# Patient Record
Sex: Male | Born: 1960 | Race: White | Hispanic: No | Marital: Married
Health system: Southern US, Community
[De-identification: ages and names within clinical notes are randomized; demographics above are authoritative.]

## PROBLEM LIST (undated history)

## (undated) HISTORY — PX: CARPAL TUNNEL RELEASE: SHX101

## (undated) HISTORY — PX: APPENDECTOMY: SHX54

## (undated) HISTORY — PX: LEFT HEART CATH: SHX5946

## (undated) HISTORY — PX: ANTERIOR CRUCIATE LIGAMENT REPAIR: SHX115

---

## 2007-10-08 ENCOUNTER — Ambulatory Visit: Payer: Self-pay | Admitting: Internal Medicine

## 2008-10-15 ENCOUNTER — Emergency Department: Payer: Self-pay | Admitting: Emergency Medicine

## 2018-12-10 ENCOUNTER — Other Ambulatory Visit: Payer: Self-pay | Admitting: Obstetrics and Gynecology

## 2018-12-10 ENCOUNTER — Other Ambulatory Visit: Payer: Self-pay | Admitting: Otolaryngology

## 2018-12-10 DIAGNOSIS — H90A22 Sensorineural hearing loss, unilateral, left ear, with restricted hearing on the contralateral side: Secondary | ICD-10-CM

## 2018-12-25 ENCOUNTER — Ambulatory Visit: Payer: BC Managed Care – PPO

## 2018-12-28 ENCOUNTER — Other Ambulatory Visit: Payer: Self-pay

## 2018-12-28 ENCOUNTER — Ambulatory Visit
Admission: RE | Admit: 2018-12-28 | Discharge: 2018-12-28 | Disposition: A | Discharge status: Home / Self Care | Payer: BC Managed Care – PPO | Source: Ambulatory Visit | Attending: Otolaryngology | Admitting: Otolaryngology

## 2018-12-28 DIAGNOSIS — H90A22 Sensorineural hearing loss, unilateral, left ear, with restricted hearing on the contralateral side: Secondary | ICD-10-CM

## 2018-12-28 MED ORDER — GADOBUTROL 1 MMOL/ML IV SOLN
10.0000 mL | Freq: Once | INTRAVENOUS | Status: AC | PRN
  Administered 2018-12-28: 10 mL via INTRAVENOUS

## 2019-03-15 ENCOUNTER — Ambulatory Visit: Payer: Self-pay

## 2019-03-15 DIAGNOSIS — Z23 Encounter for immunization: Secondary | ICD-10-CM

## 2019-06-20 ENCOUNTER — Ambulatory Visit: Payer: BC Managed Care – PPO | Attending: Internal Medicine

## 2019-06-20 DIAGNOSIS — Z20822 Contact with and (suspected) exposure to covid-19: Secondary | ICD-10-CM

## 2019-06-22 LAB — NOVEL CORONAVIRUS, NAA: SARS-CoV-2, NAA: NOT DETECTED

## 2019-12-10 ENCOUNTER — Telehealth: Payer: Self-pay

## 2019-12-17 NOTE — Telephone Encounter (Signed)
Script note 

## 2021-03-30 IMAGING — MR MR BRAIN/IAC WITHOUT AND WITH CONTRAST
10 of 14 series · 27 of 48 positions shown · IV contrast (gadavist)
Comparison: No prior studies available for comparison

CLINICAL DATA: Meniere's disease times 15 years, vertigo, left ear
pain and hearing loss, attention IAC's

EXAM:
MRI HEAD WITHOUT AND WITH CONTRAST
TECHNIQUE: Multiplanar, multiecho pulse sequences of the brain and surrounding
structures were obtained without and with intravenous contrast.
CONTRAST:  10 mL intravenous Gadavist

[Series 5: T1 · sagittal · 5.0mm · 0.62mm/px · 2 of 25 slices shown (1 of 3)]
[im 1/25]
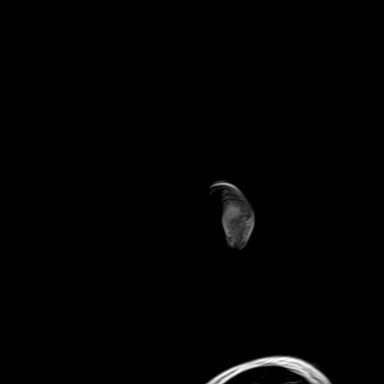
[im 25/25]
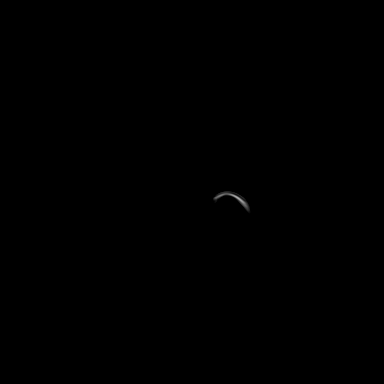

[Series 6: ax dwi_tracew · axial · 3.0mm · 0.73mm/px · z∈[-95,+62]mm · 4 of 55 slices shown]
[im 1/55]
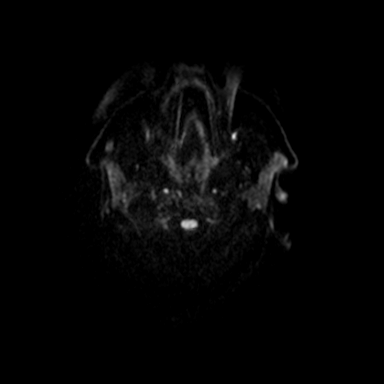
[im 19/55]
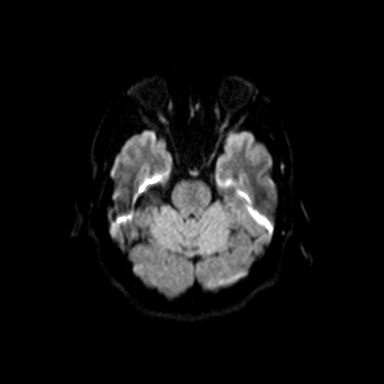
[im 37/55]
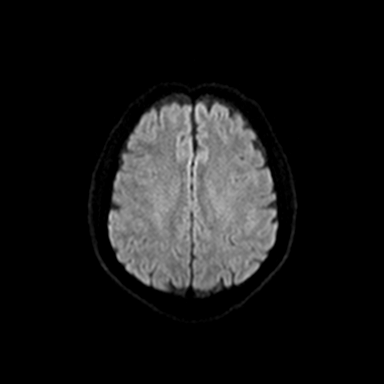
[im 55/55]
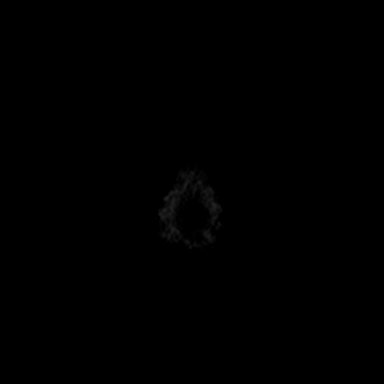

[Series 7: ax dwi_adc · axial · 3.0mm · 0.73mm/px · z∈[-95,-43]mm · 2 of 55 slices shown]
[im 1/55]
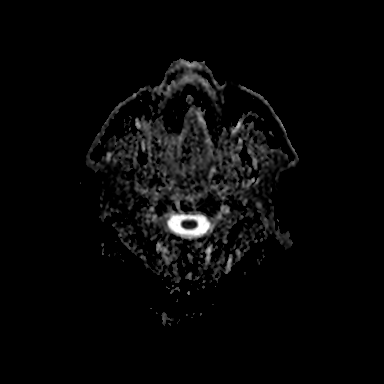
[im 19/55]
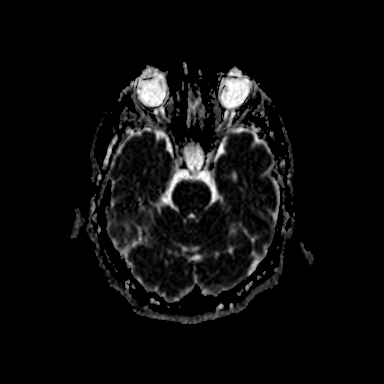

[Series 8: T2 · axial · 5.0mm · 0.53mm/px · z∈[-89,+51]mm · 2 of 25 slices shown]
[im 1/25]
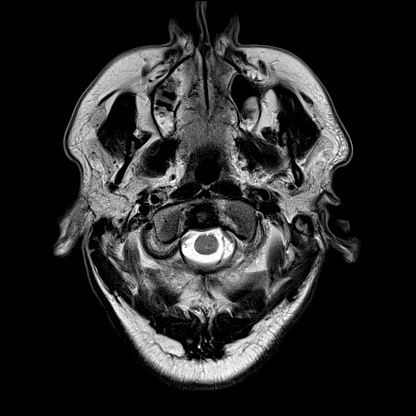
[im 25/25]
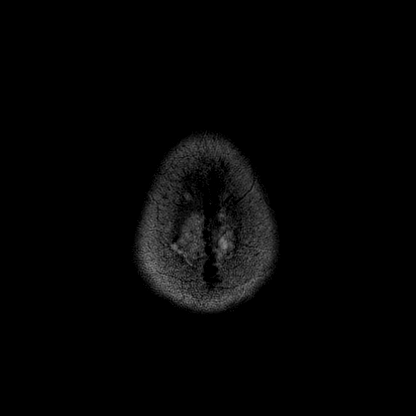

[Series 13: T1 · coronal · non-contrast · 3.0mm · 0.21mm/px · 1 of 13 slices shown (2 of 3)]
[im 1/13]
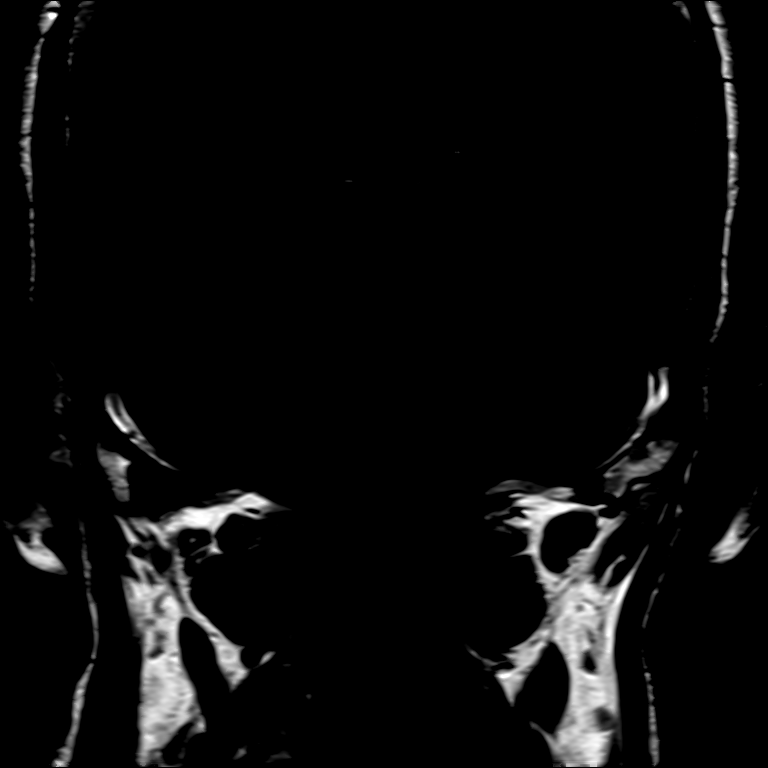

[Series 14: FLAIR · axial · 3.0mm · 0.53mm/px · z∈[-98,+60]mm · 4 of 55 slices shown]
[im 1/55]
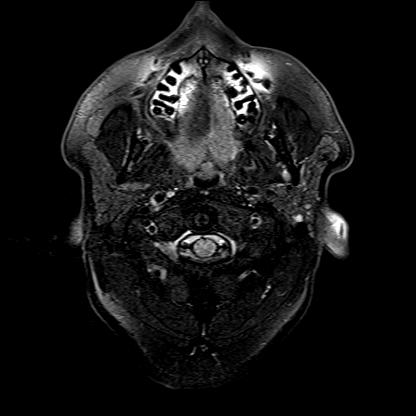
[im 19/55]
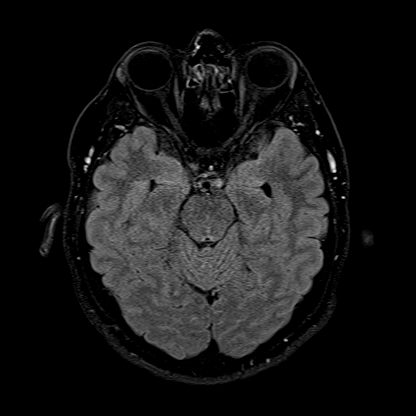
[im 37/55]
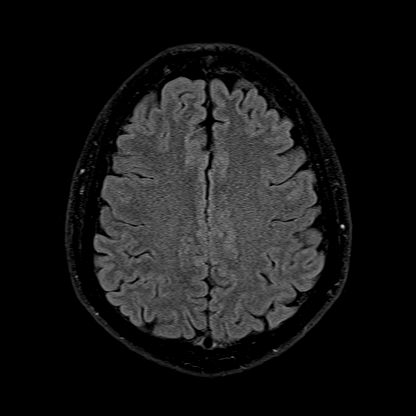
[im 55/55]
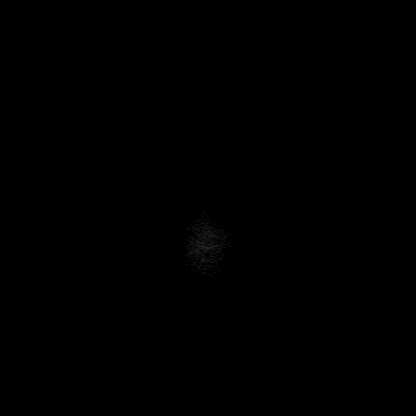

[Series 16: T1 · axial · non-contrast · 3.0mm · 0.21mm/px · 1 of 15 slices shown (3 of 3)]
[im 1/15]
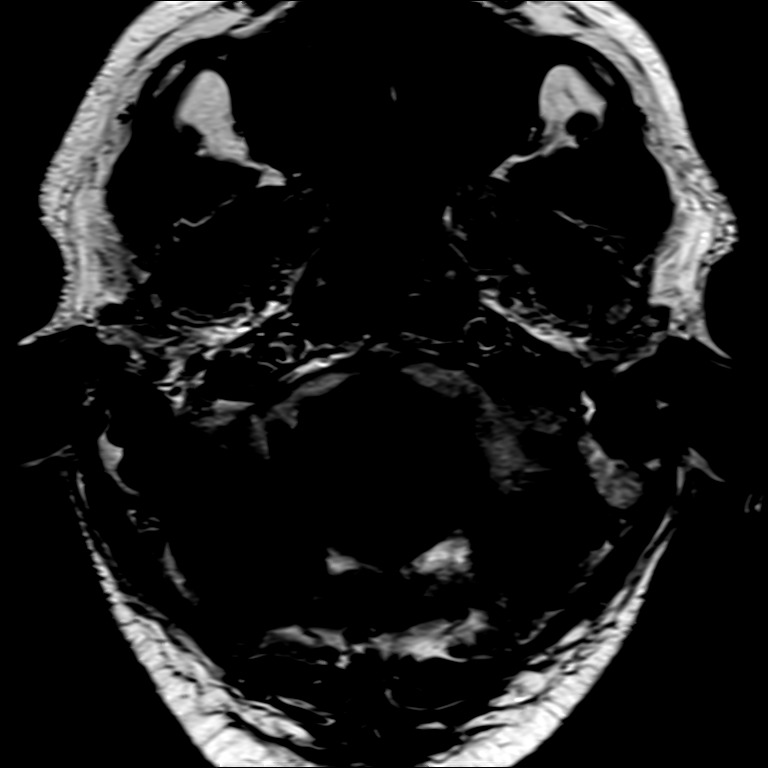

[Series 17: T1 post-contrast · axial · 3.0mm · 0.21mm/px · 1 of 15 slices shown (1 of 3)]
[im 1/15]
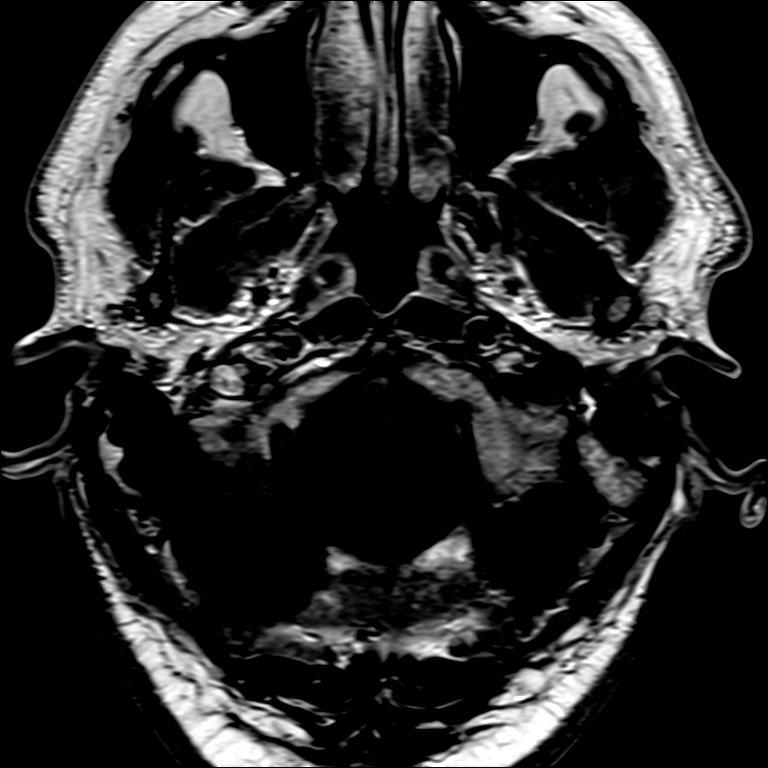

[Series 18: T1 post-contrast · coronal · 3.0mm · 0.21mm/px · 1 of 13 slices shown (2 of 3)]
[im 1/13]
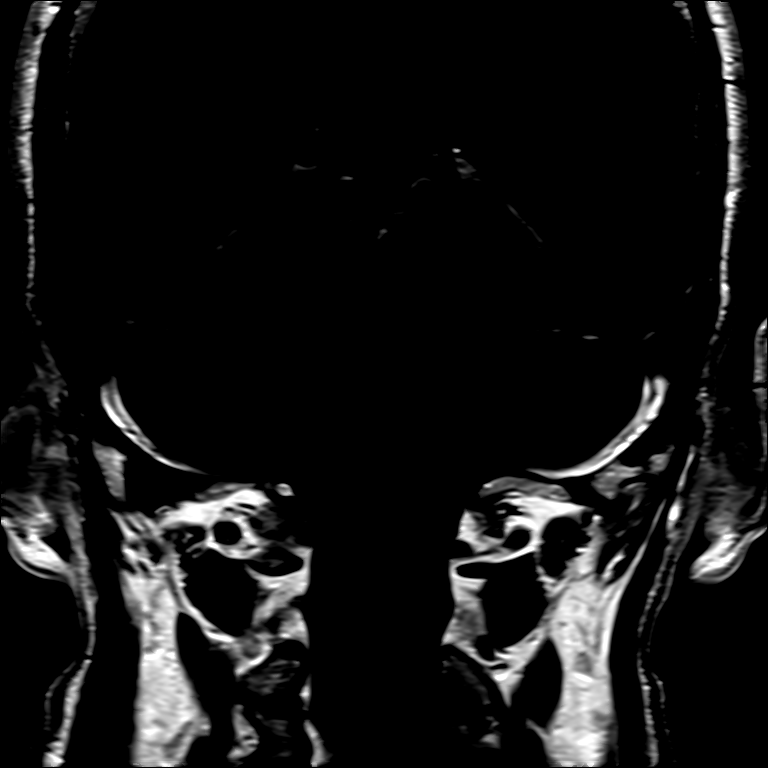

[Series 19: T1 post-contrast · axial · 1.0mm · 0.98mm/px · z∈[-102,+69]mm · 9 of 176 slices shown (3 of 3)]
[im 1/176]
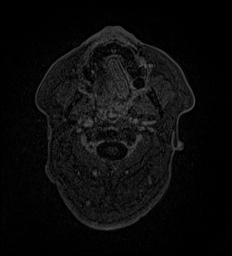
[im 32/176]
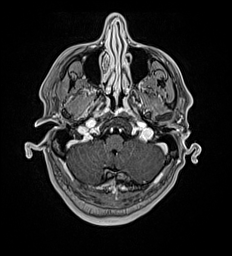
[im 48/176]
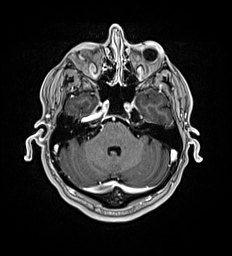
[im 80/176]
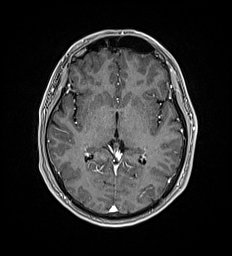
[im 96/176]
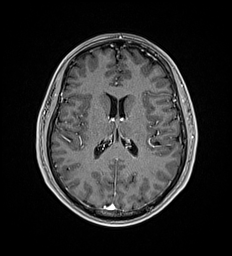
[im 128/176]
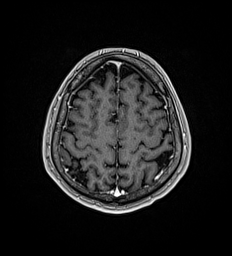
[im 144/176]
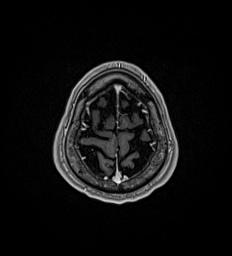
[im 160/176]
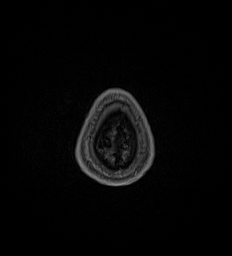
[im 176/176]
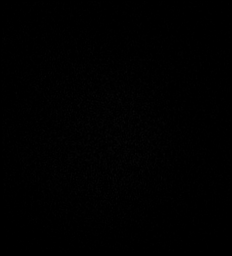

[27 of 48 positions shown; findings below may reference images not displayed]

FINDINGS: Brain: There is no focal parenchymal signal abnormality. No acute
infarct, intracranial mass, intracranial hemorrhage, midline shift
or extra-axial collection. Specifically, no cerebellopontine angle
mass or internal auditory canal lesion is demonstrated. The seventh
and eighth cranial nerves are unremarkable bilaterally.

Vascular: Normal flow voids.

Skull and upper cervical spine: Normal marrow signal.

Sinuses/Orbits: The globes and orbits are unremarkable. No
significant paranasal sinus disease or mastoid effusion.
IMPRESSION: No cerebellopontine angle mass or internal auditory canal lesion.
The seventh and eighth cranial nerves are unremarkable bilaterally.

## 2021-06-13 DIAGNOSIS — H8102 Meniere's disease, left ear: Secondary | ICD-10-CM

## 2021-06-13 HISTORY — DX: Meniere's disease, left ear: H81.02

## 2023-03-28 ENCOUNTER — Encounter: Payer: Self-pay | Admitting: *Deleted

## 2023-03-28 ENCOUNTER — Ambulatory Visit
Admission: EM | Admit: 2023-03-28 | Discharge: 2023-03-28 | Disposition: A | Discharge status: Home / Self Care | Payer: BC Managed Care – PPO | Attending: Emergency Medicine | Admitting: Emergency Medicine

## 2023-03-28 DIAGNOSIS — J069 Acute upper respiratory infection, unspecified: Secondary | ICD-10-CM

## 2023-03-28 MED ORDER — DOXYCYCLINE HYCLATE 100 MG PO CAPS: 100.0000 mg | ORAL_CAPSULE | Freq: Two times a day (BID) | ORAL | 0 refills | Status: AC

## 2023-03-28 NOTE — Discharge Instructions (Addendum)
Rest,push fluids,May try using over the counter throat lozenges, mucinex or sudafed as label directed, hot tea, honey for cough.  Take antibiotic as directed, drink plenty of water.  If you have worsening symptoms such as chest pain palpitations shortness of breath you will need to go to the emergency room for further evaluation.

## 2023-03-28 NOTE — ED Provider Notes (Signed)
Vincent Arias    CSN: [id number] Arrival date & time: 03/28/23  1848      History   Chief Complaint Chief Complaint  Patient presents with   Cough    HPI Vincent Arias is a 62 y.o. male.   62 year old male pt, Vincent Arias, presents to urgent care for evaluation of cough/chest congestion x 1 week. Wife had recent URI and was treated with abx and imporved pt is going out of town next week, hx of pneumonia last year.   The history is provided by the patient. No language interpreter was used.    Past Medical History:  Diagnosis Date   Meniere disease of left ear 2023   had surgery to repair    Patient Active Problem List   Diagnosis Date Noted   Acute URI 03/28/2023    History reviewed. No pertinent surgical history.     Home Medications    Prior to Admission medications   Not on File    Family History History reviewed. No pertinent family history.  Social History Social History   Tobacco Use   Smoking status: Never   Smokeless tobacco: Never  Vaping Use   Vaping status: Never Used  Substance Use Topics   Alcohol use: Not Currently     Allergies   Patient has no allergy information on record.   Review of Systems Review of Systems  Constitutional:  Negative for fever.  HENT:  Positive for congestion.   Respiratory:  Positive for cough. Negative for wheezing.   Cardiovascular:  Negative for chest pain and palpitations.  All other systems reviewed and are negative.    Physical Exam Triage Vital Signs ED Triage Vitals  Encounter Vitals Group     BP 03/28/23 1859 (!) 134/90     Systolic BP Percentile --      Diastolic BP Percentile --      Pulse Rate 03/28/23 1859 99     Resp 03/28/23 1859 18     Temp 03/28/23 1859 98.5 F (36.9 C)     Temp src --      SpO2 03/28/23 1859 95 %     Weight 03/28/23 1907 220 lb (99.8 kg)     Height 03/28/23 1907 5\' 9"  (1.753 m)     Head Circumference --      Peak Flow --      Pain  Score 03/28/23 1906 4     Pain Loc --      Pain Education --      Exclude from Growth Chart --    No data found.  Updated Vital Signs BP (!) 134/90   Pulse 99   Temp 98.5 F (36.9 C)   Resp 18   Ht 5\' 9"  (1.753 m)   Wt 220 lb (99.8 kg)   SpO2 95%   BMI 32.49 kg/m   Visual Acuity Right Eye Distance:   Left Eye Distance:   Bilateral Distance:    Right Eye Near:   Left Eye Near:    Bilateral Near:     Physical Exam Vitals and nursing note reviewed.  Constitutional:      General: He is active. He is not in acute distress.    Appearance: He is well-developed. He is not ill-appearing or toxic-appearing.  HENT:     Head: Normocephalic.     Right Ear: Tympanic membrane is retracted.     Left Ear: Tympanic membrane is retracted.     Nose:  Mucosal edema present.     Mouth/Throat:     Mouth: Oropharynx is clear and moist and mucous membranes are normal.     Pharynx: Uvula midline.  Eyes:     General: Lids are normal.     Extraocular Movements: EOM normal.     Conjunctiva/sclera: Conjunctivae normal.     Pupils: Pupils are equal, round, and reactive to light.  Cardiovascular:     Rate and Rhythm: Normal rate and regular rhythm.     Pulses: Normal pulses.     Heart sounds: Normal heart sounds.  Pulmonary:     Effort: Pulmonary effort is normal. No respiratory distress.     Breath sounds: Normal breath sounds and air entry. No decreased breath sounds or wheezing.  Abdominal:     General: There is no distension.     Palpations: Abdomen is soft.  Musculoskeletal:        General: Normal range of motion.     Cervical back: Normal range of motion.  Skin:    General: Skin is warm, dry and intact.     Findings: No rash.  Neurological:     General: No focal deficit present.     Mental Status: He is alert and oriented to person, place, and time.     GCS: GCS eye subscore is 4. GCS verbal subscore is 5. GCS motor subscore is 6.     Cranial Nerves: No cranial nerve deficit.      Sensory: No sensory deficit.  Psychiatric:        Attention and Perception: Attention normal.        Mood and Affect: Mood and affect and mood normal.        Speech: Speech normal.        Behavior: Behavior normal. Behavior is cooperative.      UC Treatments / Results  Labs (all labs ordered are listed, but only abnormal results are displayed) Labs Reviewed - No data to display  EKG   Radiology No results found.  Procedures Procedures (including critical care time)  Medications Ordered in UC Medications - No data to display  Initial Impression / Assessment and Plan / UC Course  I have reviewed the triage vital signs and the nursing notes.  Pertinent labs & imaging results that were available during my care of the patient were reviewed by me and considered in my medical decision making (see chart for details).     Ddx: Acute urti, viral illness,allergies Final Clinical Impressions(s) / UC Diagnoses   Final diagnoses:  Acute URI     Discharge Instructions      Rest,push fluids,May try using over the counter throat lozenges, mucinex or sudafed as label directed, hot tea, honey for cough.  Take antibiotic as directed, drink plenty of water.  If you have worsening symptoms such as chest pain palpitations shortness of breath you will need to go to the emergency room for further evaluation.       ED Prescriptions   None    PDMP not reviewed this encounter.   Clancy Gourd, NP 03/28/23 1924

## 2023-03-28 NOTE — ED Triage Notes (Signed)
Patient states cough/chest congestion for 1 week, feels warm at times but no fever when checked, bodyaches.  Taking OTC Tylenol, Dayquil.

## 2023-06-26 ENCOUNTER — Ambulatory Visit
Admission: EM | Admit: 2023-06-26 | Discharge: 2023-06-26 | Disposition: A | Discharge status: Home / Self Care | Payer: BC Managed Care – PPO

## 2023-06-26 DIAGNOSIS — M542 Cervicalgia: Secondary | ICD-10-CM | POA: Diagnosis not present

## 2023-06-26 MED ORDER — PREDNISONE 10 MG (21) PO TBPK: ORAL_TABLET | Freq: Every day | ORAL | 0 refills | Status: DC

## 2023-06-26 NOTE — Discharge Instructions (Signed)
 I believe your symptoms are related to muscular irritation with nerve compression which has caused pain to radiate into the arms and jolt into the chest  Do not believe your heart is involved at this time due to timeline of symptoms and then present  EKG shows that heart is beating in a regular pace and rhythm and blood pressure and heart rate are within normal ranges  Stop meloxicam temporarily and begin prednisone  every morning with food for 6 days to reduce internal inflammation which will release with nerve compression and help with discomfort  You may continue use of Tylenol additionally as needed  May use heat over the affected area 10 to 15-minute intervals  May massage and stretch area as tolerated  If your symptoms continue to persist please follow-up with your primary doctor for reevaluation

## 2023-06-26 NOTE — ED Provider Notes (Signed)
 [facility]    CSN: [id number] Arrival date & time: 06/26/23  1449      History   Chief Complaint Chief Complaint  Patient presents with   Chest Pain    HPI Vincent Arias is a 63 y.o. male.   Presents for evaluation of neck pain present at the base of the skull, pain behind the right arm radiating down the right side of the back and down the right with numbness experienced in the hand beginning 3 to 4 months ago.  Has intermittent sharp jolting pain to the center of the chest.  Symptoms occurring intermittently.  Denies direct injury but endorses pushing pulling lifting [place]  Endorses to the nurse that this feels like muscle pain but endorses to this provider that it feels like nerve discomfort.  Has been intermittently experiencing dizziness described as feeling unbalanced like on a boat with visual changes during time of occurrence, history of Mnire's, feels that current symptoms are exacerbating dizziness from baseline.  Endorses that he feels visual changes are related to his eyes and needing new glasses as he has not seen the ophthalmologist in 2 years and has been having issues with vision while driving for long distance.  Denies head injury, lightheadedness, syncope, memory or speech changes or weakness.  Has been taking Tylenol arthritis which has been somewhat helpful.  History of hypertension hyperlipidemia.  Has PCP.  Past Medical History:  Diagnosis Date   Meniere disease of left ear 2023   had surgery to repair    Patient Active Problem List   Diagnosis Date Noted   Acute URI 03/28/2023    Past Surgical History:  Procedure Laterality Date   ANTERIOR CRUCIATE LIGAMENT REPAIR     APPENDECTOMY     CARPAL TUNNEL RELEASE     LEFT HEART CATH         Home Medications    Prior to Admission medications   Medication Sig Start Date End Date Taking? Authorizing Provider  azelastine (ASTELIN) 0.1 % nasal spray Place into  the nose. 01/13/23  Yes [provider]  meclizine (ANTIVERT) 25 MG tablet Take by mouth. 12/10/18  Yes [provider]  predniSONE  (STERAPRED UNI-PAK 21 TAB) 10 MG (21) TBPK tablet Take by mouth daily. Take 6 tabs by mouth daily  for 1 days, then 5 tabs for 1 days, then 4 tabs for 1 days, then 3 tabs for 1 days, 2 tabs for 1 days, then 1 tab by mouth daily for 1 days 06/26/23  Yes Simcha Speir R, NP  amphetamine-dextroamphetamine (ADDERALL XR) 20 MG 24 hr capsule Take 20 mg by mouth every morning.    [provider]  fluticasone (FLONASE) 50 MCG/ACT nasal spray Place 2 sprays into both nostrils daily.    [provider]    Family History History reviewed. No pertinent family history.  Social History Social History   Tobacco Use   Smoking status: Never   Smokeless tobacco: Never  Vaping Use   Vaping status: Never Used  Substance Use Topics   Alcohol use: Not Currently     Allergies   Patient has no known allergies.   Review of Systems Review of Systems   Physical Exam Triage Vital Signs ED Triage Vitals  Encounter Vitals Group     BP 06/26/23 1522 138/80     Systolic BP Percentile --      Diastolic BP Percentile --  Pulse Rate 06/26/23 1522 85     Resp 06/26/23 1522 17     Temp 06/26/23 1522 98 F (36.7 C)     Temp src --      SpO2 06/26/23 1522 98 %     Weight --      Height --      Head Circumference --      Peak Flow --      Pain Score 06/26/23 1513 0     Pain Loc --      Pain Education --      Exclude from Growth Chart --    No data found.  Updated Vital Signs BP 138/80   Pulse 85   Temp 98 F (36.7 C)   Resp 17   SpO2 98%   Visual Acuity Right Eye Distance:   Left Eye Distance:   Bilateral Distance:    Right Eye Near:   Left Eye Near:    Bilateral Near:     Physical Exam Constitutional:      Appearance: Normal appearance.  Eyes:     Extraocular Movements: Extraocular movements intact.  Neck:      Comments: Unable to reproduce neck pain, has full range of motion, 2+ carotid pulses bilaterally strength 5 out of 5 Cardiovascular:     Rate and Rhythm: Normal rate and regular rhythm.     Pulses: Normal pulses.     Heart sounds: Normal heart sounds.  Pulmonary:     Effort: Pulmonary effort is normal.     Breath sounds: Normal breath sounds.  Musculoskeletal:     Comments: Tenderness not reproducible, no ecchymosis swelling or deformity, able to complete range of motion of the bilateral upper extremities and back, strength is a 5 out of 5  Neurological:     Mental Status: He is alert and oriented to person, place, and time. Mental status is at baseline.      UC Treatments / Results  Labs (all labs ordered are listed, but only abnormal results are displayed) Labs Reviewed - No data to display  EKG   Radiology No results found.  Procedures Procedures (including critical care time)  Medications Ordered in UC Medications - No data to display  Initial Impression / Assessment and Plan / UC Course  I have reviewed the triage vital signs and the nursing notes.  Pertinent labs & imaging results that were available during my care of the patient were reviewed by me and considered in my medical decision making (see chart for details).  Neck pain  Vital signs are stable, patient is in no signs of distress nontoxic-appearing, EKG shows normal sinus rhythm, blood pressure and heart rate are within normal ranges, stable for outpatient management, etiology is most likely muscular deriving from the neck, the patient indicating pain to be to the right side during triage but during exam  indicated the left, discussed with patient we will move forward with treatment as such, takes daily meloxicam advised to stop use temporarily during course of prednisone , may take Tylenol and complete RICE additionally with follow-up with primary doctor if symptoms continue to persist worsen or recur Final  Clinical Impressions(s) / UC Diagnoses   Final diagnoses:  Neck pain     Discharge Instructions      I believe your symptoms are related to muscular irritation with nerve compression which has caused pain to radiate into the arms and jolt into the chest  Do not believe your heart is involved at  this time due to timeline of symptoms and then present  EKG shows that heart is beating in a regular pace and rhythm and blood pressure and heart rate are within normal ranges  Stop meloxicam temporarily and begin prednisone  every morning with food for 6 days to reduce internal inflammation which will release with nerve compression and help with discomfort  You may continue use of Tylenol additionally as needed  May use heat over the affected area 10 to 15-minute intervals  May massage and stretch area as tolerated  If your symptoms continue to persist please follow-up with your primary doctor for reevaluation   ED Prescriptions     Medication Sig Dispense Auth. Provider   predniSONE  (STERAPRED UNI-PAK 21 TAB) 10 MG (21) TBPK tablet Take by mouth daily. Take 6 tabs by mouth daily  for 1 days, then 5 tabs for 1 days, then 4 tabs for 1 days, then 3 tabs for 1 days, 2 tabs for 1 days, then 1 tab by mouth daily for 1 days 21 tablet Isidora Laham, Shelba SAUNDERS, NP      PDMP not reviewed this encounter.   Teresa Shelba SAUNDERS, NP 06/26/23 [phone]

## 2023-06-26 NOTE — ED Triage Notes (Signed)
 Patient to Urgent Care with complaints of back and right sided arm pain. Occasional shock or jolt of pain through his chest. Denies CP or SHOB.  Symptoms started 3-4 months ago w/ repeated arm/ back pain/ dizziness. Hx of vertigo and meniere's disease. Feels like now having more right sided arm and back pain. Describes pain feels like muscle pain.

## 2023-08-23 ENCOUNTER — Ambulatory Visit: Payer: BC Managed Care – PPO | Attending: Cardiovascular Disease | Admitting: Cardiovascular Disease

## 2023-08-23 ENCOUNTER — Encounter: Payer: Self-pay | Admitting: Cardiovascular Disease

## 2023-08-23 ENCOUNTER — Other Ambulatory Visit: Payer: Self-pay

## 2023-08-23 VITALS — BP 125/98 | HR 87 | Ht 69.0 in | Wt 211.0 lb

## 2023-08-23 DIAGNOSIS — E7800 Pure hypercholesterolemia, unspecified: Secondary | ICD-10-CM | POA: Diagnosis not present

## 2023-08-23 DIAGNOSIS — R079 Chest pain, unspecified: Secondary | ICD-10-CM

## 2023-08-23 DIAGNOSIS — G4733 Obstructive sleep apnea (adult) (pediatric): Secondary | ICD-10-CM

## 2023-08-23 DIAGNOSIS — I10 Essential (primary) hypertension: Secondary | ICD-10-CM

## 2023-08-23 DIAGNOSIS — Z01812 Encounter for preprocedural laboratory examination: Secondary | ICD-10-CM

## 2023-08-23 MED ORDER — TRIAMTERENE-HCTZ 37.5-25 MG PO TABS: 1.0000 | ORAL_TABLET | Freq: Every day | ORAL | 3 refills | Status: DC

## 2023-08-23 MED ORDER — LOSARTAN POTASSIUM 100 MG PO TABS: 50.0000 mg | ORAL_TABLET | Freq: Every day | ORAL | 3 refills | Status: DC

## 2023-08-23 MED ORDER — METOPROLOL TARTRATE 100 MG PO TABS: 100.0000 mg | ORAL_TABLET | Freq: Once | ORAL | 0 refills | Status: DC

## 2023-08-23 NOTE — Patient Instructions (Addendum)
 Medication Instructions:  Metoprolol 100 mg by mouth once 2 hours prior to CTA test.  Start Losartan 100 mg take 1/2 tablet once daily. Scripts sent *If you need a refill on your cardiac medications before your next appointment, please call your pharmacy*   Testing/Procedures:   Your cardiac CT will be scheduled at one of the below locations:   [facility] [address] [place], [place] [address] [phone]  If scheduled at [facility], please arrive at the [facility] and Children's Entrance (Entrance C2) of [facility] 30 minutes prior to test start time. You can use the FREE valet parking offered at entrance C (encouraged to control the heart rate for the test)  Proceed to the [facility] Radiology Department (first floor) to check-in and test prep.  All radiology patients and guests should use entrance C2 at [facility], accessed from [facility], even though the hospital's physical address listed is [address]    Please follow these instructions carefully (unless otherwise directed):  An IV will be required for this test and Nitroglycerin will be given.  Hold all erectile dysfunction medications at least 3 days (72 hrs) prior to test. (Ie viagra, cialis, sildenafil, tadalafil, etc)   On the Night Before the Test: Be sure to Drink plenty of water. Do not consume any caffeinated/decaffeinated beverages or chocolate 12 hours prior to your test. Do not take any antihistamines 12 hours prior to your test.   On the Day of the Test: Drink plenty of water until 1 hour prior to the test. Do not eat any food 1 hour prior to test. You may take your regular medications prior to the test.  Take metoprolol (Lopressor) two hours prior to test. If you take Furosemide/Hydrochlorothiazide/Spironolactone/Chlorthalidone, please HOLD on the morning of the test. Patients who wear a continuous glucose monitor MUST remove the device  prior to scanning.  After the Test: Drink plenty of water. After receiving IV contrast, you may experience a mild flushed feeling. This is normal. On occasion, you may experience a mild rash up to 24 hours after the test. This is not dangerous. If this occurs, you can take Benadryl 25 mg, Zyrtec, Claritin, or Allegra and increase your fluid intake. (Patients taking Tikosyn should avoid Benadryl, and may take Zyrtec, Claritin, or Allegra) If you experience trouble breathing, this can be serious. If it is severe call [phone] IMMEDIATELY. If it is mild, please call our office.  We will call to schedule your test 2-4 weeks out understanding that some insurance companies will need an authorization prior to the service being performed.   For more information and frequently asked questions, please visit our website : [email or web address]  For non-scheduling related questions, please contact the cardiac imaging nurse navigator should you have any questions/concerns: Cardiac Imaging Nurse Navigators Direct Office Dial: [phone]   For scheduling needs, including cancellations and rescheduling, please call [place], [phone].    Follow-Up: At [facility], you and your health needs are our priority.  As part of our continuing mission to provide you with exceptional heart care, we have created designated Provider Care Teams.  These Care Teams include your primary Cardiologist (physician) and Advanced Practice Providers (APPs -  Physician Assistants and Nurse Practitioners) who all work together to provide you with the care you need, when you need it.  We recommend signing up for the patient portal called "MyChart".  Sign up information is provided on this After Visit  Summary.  MyChart is used to connect with patients for Virtual Visits (Telemedicine).  Patients are able to view lab/test results, encounter notes, upcoming appointments, etc.  Non-urgent messages can be sent to your  provider as well.   To learn more about what you can do with MyChart, go to [email or web address].    Your next appointment:   1 month(s)  Provider:   First Available APP.  Then, Dr Ruby Cola will plan to see you again in 4 month(s).    Other Instructions Keep BP log daily and sent results via My Chart message.

## 2023-08-23 NOTE — Progress Notes (Addendum)
 Cardiology Office Note:    Date:  08/27/2023   ID:  Vincent Arias, DOB 1960/11/30, MRN [id number]  PCP:  Lauro Regulus, MD   [facility] Providers Cardiologist:  None     Referring MD: Lauro Regulus, MD   Chief Complaint  Patient presents with   Hypertension       Vincent Arias is a 63 y.o. male who is being seen today for the evaluation of difficult to control hypertension at the request of Lauro Regulus, MD.   History of Present Illness:    Vincent Arias is a 63 y.o. male with a hx of WPW syndrome, hypertension, OSA on CPAP, ADHD, Mnire's disease, prediabetes/hyperlipidemia, false positive stress test leading to a negative catheterization Vincent Arias, minor 15-20% plaquing in the mid LAD; 20-25% proximal RCA).    His blood pressure was previously easily controlled, but recently has become much higher and symptomatic.  In July 2024 his blood pressure was 138/79 at a visit with his PCP he has had issues with headache, dizziness and vision impairment.  At one point he also had some pain radiating down his left arm.  At home his blood pressure was as high as 159/115 mmHg.  He had a prescription for triamterene-hydrochlorothiazide which she had taken in the past for Mnire's disease and started taking it about a week ago.  Today's blood pressure on arrival was 120/100 with HTN.  When I rechecked a few minutes later the diastolic blood pressure was still very high: 125/98 mmHg.  Today he does does not feel particularly bad despite the elevation in blood pressure.  He denies headaches or any focal neurological complaints.  He has not experienced syncope or palpitations.  He denies shortness of breath or chest pain with activity, but did have pain going down his left arm when his blood pressure was very high more than a week ago.  He was prescribed a prednisone Dosepak in January.  He had completed it before his blood pressure  began to rise.  He does have a prescription for meloxicam for joint problems.  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  Ablation was unsuccessful, but it appears that he "outgrew it".  Has been many years since he had a bout of SVT.  He used CPAP for a while for sleep apnea but has been noncompliant since 2020.  He had COVID 19 infection at that time and could not tolerate CPAP.  He never started wearing it again since then.  He has been on Adderall XR for ADHD for a long time.  Labs December 19, 2022 include hemoglobin A1c 5.9%, potassium 4.5, creatinine 0.8, normal liver function tests, cholesterol 201, triglycerides 161, HDL 44, calculated LDL 125  Past Medical History:  Diagnosis Date   Meniere disease of left ear 2023   had surgery to repair    Past Surgical History:  Procedure Laterality Date   ANTERIOR CRUCIATE LIGAMENT REPAIR     APPENDECTOMY     CARPAL TUNNEL RELEASE     LEFT HEART CATH      Current Medications: Current Meds  Medication Sig   amphetamine-dextroamphetamine (ADDERALL XR) 20 MG 24 hr capsule Take 20 mg by mouth every morning.   azelastine (ASTELIN) 0.1 % nasal spray Place into the nose.   fluticasone (FLONASE) 50 MCG/ACT nasal spray Place 2 sprays into both nostrils daily.   losartan (COZAAR) 100 MG tablet Take  0.5 tablets (50 mg total) by mouth daily.   meclizine (ANTIVERT) 25 MG tablet Take by mouth as needed.   meloxicam (MOBIC) 15 MG tablet Take 15 mg by mouth daily.   metoprolol tartrate (LOPRESSOR) 100 MG tablet Take 1 tablet (100 mg total) by mouth once for 1 dose. 2 hours prior to Cta procedure.   [DISCONTINUED] triamterene-hydrochlorothiazide (MAXZIDE-25) 37.5-25 MG tablet Take 1 tablet by mouth daily.     Allergies:   Patient has no known allergies.   Family History: The patient's family history is not on file.  ROS:   Please see the history of present illness.     All other systems reviewed and are  negative.  EKGs/Labs/Other Studies Reviewed:    The following studies were reviewed today:  EKG Interpretation Date/Time:  Wednesday August 23 2023 10:13:44 EDT Ventricular Rate:  87 PR Interval:  148 QRS Duration:  82 QT Interval:  358 QTC Calculation: 430 R Axis:   -2  Text Interpretation: Normal sinus rhythm Cannot rule out Inferior infarct , age undetermined When compared with ECG of 26-Jun-2023 15:28, Criteria for Septal infarct are no longer Present Minimal criteria for Inferior infarct are now Present Confirmed by Zyairah Wacha [phone]) on 08/23/2023 10:40:55 AM    Recent Labs: 08/23/2023: ALT 43; BUN 20; Creatinine, Ser 0.94; Hemoglobin 17.3; Platelets 309; Potassium 5.1; Sodium 137  Recent Lipid Panel No results found for: "CHOL", "TRIG", "HDL", "CHOLHDL", "VLDL", "LDLCALC", "LDLDIRECT" December 19, 2022 hemoglobin A1c 5.9%, potassium 4.5, creatinine 0.8, normal liver function tests cholesterol 201, triglycerides 161, HDL 44, calculated LDL 125  Risk Assessment/Calculations:      HYPERTENSION CONTROL Vitals:   08/23/23 1009 08/27/23 2142 08/27/23 2143  BP: (!) 128/100 (!) 125/98 (!) 125/98    The patient's blood pressure is elevated above target today.  In order to address the patient's elevated BP: A new medication was prescribed today.            Physical Exam:    VS:  BP (!) 125/98   Pulse 87   Ht 5\' 9"  (1.753 m)   Wt 211 lb (95.7 kg)   SpO2 96%   BMI 31.16 kg/m     Wt Readings from Last 3 Encounters:  08/23/23 211 lb (95.7 kg)  03/28/23 220 lb (99.8 kg)     GEN: Mildly obese, well nourished, well developed in no acute distress HEENT: Normal NECK: No JVD; No carotid bruits LYMPHATICS: No lymphadenopathy CARDIAC: RRR, no murmurs, rubs, gallops RESPIRATORY:  Clear to auscultation without rales, wheezing or rhonchi  ABDOMEN: Soft, non-tender, non-distended MUSCULOSKELETAL:  No edema; No deformity  SKIN: Warm and dry NEUROLOGIC:  Alert and oriented x  3 PSYCHIATRIC:  Normal affect   ASSESSMENT:    1. Hypertension, unspecified type   2. Chest pain of uncertain etiology   3. Chest pain, unspecified type   4. Pre-procedure lab exam   5. Hypercholesterolemia   6. OSA (obstructive sleep apnea)    PLAN:    In order of problems listed above:  Chest pain: He had clear evidence of coronary atherosclerosis and remote cardiac catheterization, albeit nonobstructive.  It is possible that his angina occurred due to severe blood pressure elevation, but recommend repeat evaluation with coronary CT angiography.  It take aspirin 81 mg daily until we clarify his diagnosis.  Did not take the diuretic all losartan on the day of his CT. HTN: Relatively late onset of hypertension and severely elevated diastolic blood pressure raises concern  for possible renal artery stenosis.  Will start treatment with losartan 50 mg once daily, but gave him a prescription for the 100 mg tablets since I think that is eventually the dose he will need.  Schedule for renal duplex ultrasound if his blood pressure fails to decrease or if we find that he has worsening renal function.  He should stop taking long-acting NSAID such as meloxicam and use short acting agents such as ibuprofen very sparingly.  Prefer acetaminophen. HLP: Will wait for the findings of CT angiography to decide on target LDL cholesterol, but the most recent value of LDL 125 is too high regards.  If he has substantial CAD, plan target LDL less than 70 and he will require statin therapy with a potent agent such as atorvastatin or rosuvastatin. OSA: Encourage 100% compliance with CPAP since untreated sleep apnea may also increase his blood pressure.  He has not used CPAP in the last 5 years since his COVID-19 infection.  Will probably need to start up again with a new split-night study.           Medication Adjustments/Labs and Tests Ordered: Current medicines are reviewed at length with the patient today.   Concerns regarding medicines are outlined above.  Orders Placed This Encounter  Procedures   CT CORONARY MORPH W/CTA COR W/SCORE W/CA W/CM &/OR WO/CM   CBC with Differential/Platelet   Comprehensive Metabolic Panel (CMET)   EKG 12-Lead   Meds ordered this encounter  Medications   losartan (COZAAR) 100 MG tablet    Sig: Take 0.5 tablets (50 mg total) by mouth daily.    Dispense:  90 tablet    Refill:  3   metoprolol tartrate (LOPRESSOR) 100 MG tablet    Sig: Take 1 tablet (100 mg total) by mouth once for 1 dose. 2 hours prior to Cta procedure.    Dispense:  1 tablet    Refill:  0    Patient Instructions  Medication Instructions:  Metoprolol 100 mg by mouth once 2 hours prior to CTA test.  Start Losartan 100 mg take 1/2 tablet once daily. Scripts sent *If you need a refill on your cardiac medications before your next appointment, please call your pharmacy*   Testing/Procedures:   Your cardiac CT will be scheduled at one of the below locations:   [facility] [address] [place], [place] [address] [phone]  If scheduled at [facility], please arrive at the [facility] and Children's Entrance (Entrance C2) of [facility] 30 minutes prior to test start time. You can use the FREE valet parking offered at entrance C (encouraged to control the heart rate for the test)  Proceed to the [facility] Radiology Department (first floor) to check-in and test prep.  All radiology patients and guests should use entrance C2 at [facility], accessed from [facility], even though the hospital's physical address listed is [address].    Please follow these instructions carefully (unless otherwise directed):  An IV will be required for this test and Nitroglycerin will be given.  Hold all erectile dysfunction medications at least 3 days (72 hrs) prior to test. (Ie viagra, cialis, sildenafil, tadalafil, etc)   On the Night Before  the Test: Be sure to Drink plenty of water. Do not consume any caffeinated/decaffeinated beverages or chocolate 12 hours prior to your test. Do not take any antihistamines 12 hours prior to your test.   On the Day of the  Test: Drink plenty of water until 1 hour prior to the test. Do not eat any food 1 hour prior to test. You may take your regular medications prior to the test.  Take metoprolol (Lopressor) two hours prior to test. If you take Furosemide/Hydrochlorothiazide/Spironolactone/Chlorthalidone, please HOLD on the morning of the test. Patients who wear a continuous glucose monitor MUST remove the device prior to scanning.  After the Test: Drink plenty of water. After receiving IV contrast, you may experience a mild flushed feeling. This is normal. On occasion, you may experience a mild rash up to 24 hours after the test. This is not dangerous. If this occurs, you can take Benadryl 25 mg, Zyrtec, Claritin, or Allegra and increase your fluid intake. (Patients taking Tikosyn should avoid Benadryl, and may take Zyrtec, Claritin, or Allegra) If you experience trouble breathing, this can be serious. If it is severe call 911 IMMEDIATELY. If it is mild, please call our office.  We will call to schedule your test 2-4 weeks out understanding that some insurance companies will need an authorization prior to the service being performed.   For more information and frequently asked questions, please visit our website : [email or web address]  For non-scheduling related questions, please contact the cardiac imaging nurse navigator should you have any questions/concerns: Cardiac Imaging Nurse Navigators Direct Office Dial: [phone]   For scheduling needs, including cancellations and rescheduling, please call [place], [phone].    Follow-Up: At [facility], you and your health needs are our priority.  As part of our continuing mission to provide you with  exceptional heart care, we have created designated Provider Care Teams.  These Care Teams include your primary Cardiologist (physician) and Advanced Practice Providers (APPs -  Physician Assistants and Nurse Practitioners) who all work together to provide you with the care you need, when you need it.  We recommend signing up for the patient portal called "MyChart".  Sign up information is provided on this After Visit Summary.  MyChart is used to connect with patients for Virtual Visits (Telemedicine).  Patients are able to view lab/test results, encounter notes, upcoming appointments, etc.  Non-urgent messages can be sent to your provider as well.   To learn more about what you can do with MyChart, go to [email or web address].    Your next appointment:   1 month(s)  Provider:   First Available APP.  Then, Dr Ruby Cola will plan to see you again in 4 month(s).    Other Instructions Keep BP log daily and sent results via My Chart message.       Signed, Thurmon Fair, MD  08/27/2023 9:49 PM    [facility]

## 2023-08-24 ENCOUNTER — Telehealth: Payer: Self-pay | Admitting: Cardiology

## 2023-08-24 ENCOUNTER — Other Ambulatory Visit: Payer: Self-pay

## 2023-08-24 ENCOUNTER — Encounter: Payer: Self-pay | Admitting: Cardiovascular Disease

## 2023-08-24 DIAGNOSIS — G4733 Obstructive sleep apnea (adult) (pediatric): Secondary | ICD-10-CM

## 2023-08-24 LAB — CBC WITH DIFFERENTIAL/PLATELET
Basophils Absolute: 0.1 10*3/uL (ref 0.0–0.2)
Basos: 1 %
EOS (ABSOLUTE): 0.6 10*3/uL — ABNORMAL HIGH (ref 0.0–0.4)
Eos: 7 %
Hematocrit: 50.7 % (ref 37.5–51.0)
Hemoglobin: 17.3 g/dL (ref 13.0–17.7)
Immature Grans (Abs): 0 10*3/uL (ref 0.0–0.1)
Immature Granulocytes: 0 %
Lymphocytes Absolute: 3.7 10*3/uL — ABNORMAL HIGH (ref 0.7–3.1)
Lymphs: 39 %
MCH: 31.3 pg (ref 26.6–33.0)
MCHC: 34.1 g/dL (ref 31.5–35.7)
MCV: 92 fL (ref 79–97)
Monocytes Absolute: 0.9 10*3/uL (ref 0.1–0.9)
Monocytes: 9 %
Neutrophils Absolute: 4.2 10*3/uL (ref 1.4–7.0)
Neutrophils: 44 %
Platelets: 309 10*3/uL (ref 150–450)
RBC: 5.53 x10E6/uL (ref 4.14–5.80)
RDW: 12.3 % (ref 11.6–15.4)
WBC: 9.4 10*3/uL (ref 3.4–10.8)

## 2023-08-24 LAB — COMPREHENSIVE METABOLIC PANEL
ALT: 43 IU/L (ref 0–44)
AST: 34 IU/L (ref 0–40)
Albumin: 4.7 g/dL (ref 3.9–4.9)
Alkaline Phosphatase: 84 IU/L (ref 44–121)
BUN/Creatinine Ratio: 21 (ref 10–24)
BUN: 20 mg/dL (ref 8–27)
Bilirubin Total: 0.5 mg/dL (ref 0.0–1.2)
CO2: 23 mmol/L (ref 20–29)
Calcium: 10.2 mg/dL (ref 8.6–10.2)
Chloride: 98 mmol/L (ref 96–106)
Creatinine, Ser: 0.94 mg/dL (ref 0.76–1.27)
Globulin, Total: 3 g/dL (ref 1.5–4.5)
Glucose: 97 mg/dL (ref 70–99)
Potassium: 5.1 mmol/L (ref 3.5–5.2)
Sodium: 137 mmol/L (ref 134–144)
Total Protein: 7.7 g/dL (ref 6.0–8.5)
eGFR: 92 mL/min/{1.73_m2} (ref >59)

## 2023-08-24 NOTE — Telephone Encounter (Signed)
 Split Night Study ordered - patient will need appointment either after split night study or once patient receives machine. Dependant upon Dr. Norris Cross request on when to see patient.

## 2023-08-24 NOTE — Progress Notes (Signed)
 Spoke with Vincent Arias regarding the home cpap titration message from yesterdays office visit with Dr Royann Shivers. She states since the patient has not been using his CPAP and has not been seen at this facility he needs a split night study ordered which has been placed on his chart.

## 2023-08-27 ENCOUNTER — Encounter: Payer: Self-pay | Admitting: Cardiovascular Disease

## 2023-08-31 ENCOUNTER — Telehealth: Payer: Self-pay

## 2023-08-31 NOTE — Telephone Encounter (Signed)
-----   Message from Armanda Magic sent at 08/24/2023  8:43 AM EDT ----- Regarding: RE: CPAP Titration He will need a split night sleep study ordered first - can you get Dr. Royann Shivers to order it please ----- Message ----- From: Brunetta Genera, LPN Sent: 1/61/0960   8:41 AM EDT To: Quintella Reichert, MD Subject: CPAP Titration                                 Patient was seen by Dr. Royann Shivers yesterday and he wants to start back using his CPAP. I sent Tacey Ruiz a message to get him on your schedule. He has not used his device in over 4 years and wants to have a Titration to get started.

## 2023-08-31 NOTE — Telephone Encounter (Signed)
 Split night sleep study ordered 08/24/23

## 2023-09-01 ENCOUNTER — Telehealth (HOSPITAL_COMMUNITY): Payer: Self-pay | Admitting: *Deleted

## 2023-09-01 NOTE — Telephone Encounter (Signed)
Reaching out to patient to offer assistance regarding upcoming cardiac imaging study; pt verbalizes understanding of appt date/time, parking situation and where to check in, pre-test NPO status and medications ordered, and verified current allergies; name and call back number provided for further questions should they arise  Gordy Clement RN Navigator Cardiac Imaging [facility] [phone] office [phone] cell  Patient to take '100mg'$  metoprolol tartrate two hours prior to his cardiac CT scan. He is aware to arrive at 1:30 PM.

## 2023-09-04 ENCOUNTER — Ambulatory Visit (HOSPITAL_COMMUNITY)
Admission: RE | Admit: 2023-09-04 | Discharge: 2023-09-04 | Disposition: A | Discharge status: Home / Self Care | Source: Ambulatory Visit | Attending: Cardiovascular Disease | Admitting: Cardiovascular Disease

## 2023-09-04 DIAGNOSIS — R079 Chest pain, unspecified: Secondary | ICD-10-CM | POA: Insufficient documentation

## 2023-09-04 DIAGNOSIS — R931 Abnormal findings on diagnostic imaging of heart and coronary circulation: Secondary | ICD-10-CM | POA: Insufficient documentation

## 2023-09-04 DIAGNOSIS — I2510 Atherosclerotic heart disease of native coronary artery without angina pectoris: Secondary | ICD-10-CM

## 2023-09-04 MED ORDER — NITROGLYCERIN 0.4 MG SL SUBL
SUBLINGUAL_TABLET | SUBLINGUAL | Status: AC
  Filled 2023-09-04: qty 2

## 2023-09-04 MED ORDER — IOHEXOL 350 MG/ML SOLN
95.0000 mL | Freq: Once | INTRAVENOUS | Status: AC | PRN
  Administered 2023-09-04: 95 mL via INTRAVENOUS

## 2023-09-04 MED ORDER — METOPROLOL TARTRATE 5 MG/5ML IV SOLN: 10.0000 mg | Freq: Once | INTRAVENOUS | Status: DC | PRN

## 2023-09-04 MED ORDER — DILTIAZEM HCL 25 MG/5ML IV SOLN: 10.0000 mg | INTRAVENOUS | Status: DC | PRN

## 2023-09-04 MED ORDER — NITROGLYCERIN 0.4 MG SL SUBL
0.8000 mg | SUBLINGUAL_TABLET | Freq: Once | SUBLINGUAL | Status: AC
  Administered 2023-09-04: 0.4 mg via SUBLINGUAL

## 2023-09-05 ENCOUNTER — Ambulatory Visit (HOSPITAL_BASED_OUTPATIENT_CLINIC_OR_DEPARTMENT_OTHER)
Admission: RE | Admit: 2023-09-05 | Discharge: 2023-09-05 | Disposition: A | Discharge status: Home / Self Care | Source: Ambulatory Visit | Attending: Cardiology

## 2023-09-05 ENCOUNTER — Encounter: Payer: Self-pay | Admitting: Cardiovascular Disease

## 2023-09-05 ENCOUNTER — Other Ambulatory Visit: Payer: Self-pay | Admitting: Cardiology

## 2023-09-05 DIAGNOSIS — R931 Abnormal findings on diagnostic imaging of heart and coronary circulation: Secondary | ICD-10-CM

## 2023-09-06 ENCOUNTER — Other Ambulatory Visit: Payer: Self-pay | Admitting: Emergency Medicine

## 2023-09-06 ENCOUNTER — Other Ambulatory Visit (HOSPITAL_COMMUNITY): Payer: Self-pay | Admitting: Emergency Medicine

## 2023-09-06 DIAGNOSIS — E785 Hyperlipidemia, unspecified: Secondary | ICD-10-CM

## 2023-09-06 MED ORDER — ROSUVASTATIN CALCIUM 20 MG PO TABS: 20.0000 mg | ORAL_TABLET | Freq: Every day | ORAL | 3 refills | Status: DC

## 2023-09-24 NOTE — Progress Notes (Deleted)
 Cardiology Office Note    Date:  09/24/2023  ID:  Vincent Arias, DOB 19-Oct-1960, MRN [id number] PCP:  Vincent Moulding, MD  Cardiologist:  None  Electrophysiologist:  None   Chief Complaint: ***  History of Present Illness: .    Vincent Arias is a 63 y.o. male with visit-pertinent history of WPW syndrome, hypertension, OSA on CPAP, ADHD, Mnire's disease, prediabetes/hyperlipidemia, false positive stress test leading to a negative cardiac catheterization in 2014 Vincent Arias, minor 15 to 20% plaquing in the mid LAD, 20 to 25% proximal RCA).  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  Ablation was previously unsuccessful although it appears that he eventually "outgrew it".  [name] previously used CPAP for sleep apnea, stopped use in 2020, he has been referred to Dr. Micael Arias.  Patient establish care with Dr. Alvis Arias on 08/23/2023.  Patient had noted that his blood pressure is previously easily controlled but had recently become higher and he became symptomatic with this.  In July 2024 his blood pressure was 130/79 at a visit with his PCP and he had problems with headache, dizziness and vision impairment.  Patient at office visit also noted he at 1 point had some pain radiating down his left arm.  Patient reported his blood pressure was as high as 159 over 115 mmHg at home.  At office visit patient's blood pressure on arrival was 120/100, on recheck was 125 over 98 mmHg.  Patient denied any symptoms with this.  It was recommended that patient undergo coronary CT angiography.   Coronary CTA on 08/31/2023 indicated coronary artery disease with moderate stenosis, mid D1 with 50 to 69% stenosis, FFR did not indicate significant stenosis.  Coronary calcium score of 105, this was 66 percentile for age, race and sex matched control.  Total plaque volume was 264 mm which was 39th percentile for age and sex matched control, the PPV was noted to severe.  It was  recommended that patient start on rosuvastatin 20 mg daily.   CAD: Coronary CTA on 08/31/2023 indicated coronary artery disease with moderate stenosis, mid D1 with 50 to 69% stenosis, FFR did not indicate significant stenosis.  Coronary calcium score of 105, this was 66 percentile for age, race and sex matched control.  Total plaque volume was 264 mm which was 39th percentile for age and sex matched control, the PPV was noted to severe.  It was recommended that patient start on rosuvastatin 20 mg daily. Today patient reports that he  HTN: Blood pressure today At last office visit patient was started on losartan 50 mg daily.  Check be met today. Of unable to control or worsening renal function would recommend renal artery duplex.  HLP: Patient started on rosuvastatin 20 mg daily following results of coronary CTA.  Patient to have recheck of fasting lipid profile in  OSA: Patient with history of OSA.  Has been referred to Dr. Micael Arias and to undergo split-night sleep study.  Labwork independently reviewed:   ROS: .   *** denies chest pain, shortness of breath, lower extremity edema, fatigue, palpitations, melena, hematuria, hemoptysis, diaphoresis, weakness, presyncope, syncope, orthopnea, and PND.  All other systems are reviewed and otherwise negative.  Studies Reviewed: Vincent Arias    EKG:  EKG is ordered today, personally reviewed, demonstrating ***     CV Studies: Cardiac studies reviewed are outlined and summarized above. Otherwise please see EMR for full report. Cardiac Studies & Procedures   ______________________________________________________________________________________________  CT SCANS  CT CORONARY MORPH W/CTA COR W/SCORE 09/04/2023  Addendum 09/15/2023 11:39 PM ADDENDUM REPORT: 09/15/2023 23:37  EXAM: OVER-READ INTERPRETATION  CT CHEST  The following report is an over-read performed by radiologist Vincent Arias [facility] on 09/15/2023. This  over-read does not include interpretation of cardiac or coronary anatomy or pathology. The coronary CTA interpretation by the cardiologist is attached.  COMPARISON:  None.  FINDINGS: There is atelectasis in the bilateral lower lobes. Visualized lower mediastinum and upper abdomen are within normal limits. No significant osseous abnormality.  IMPRESSION: 1. No significant extracardiac findings.   Electronically Signed By: Vincent Arias M.D. On: 09/15/2023 23:37  Narrative HISTORY: Chest pain, nonspecific chest pain  EXAM: Cardiac/Coronary CT  TECHNIQUE: The patient was scanned on a [employer].  PROTOCOL: A 120 kV prospective scan was triggered in the descending thoracic aorta at 111 HU's. Axial non-contrast 3 mm slices were carried out through the heart. The data set was analyzed on a dedicated work station and scored using the Agatston method. Gantry rotation speed was 250 msecs and collimation was 0.6 mm. Heart rate was optimized medically and sl NTG was given. The 3D data set was reconstructed in 5% intervals of the 35-75 % of the R-R cycle. Systolic and diastolic phases were analyzed on a dedicated work station using MPR, MIP and VRT modes. The patient received 95mL OMNIPAQUE IOHEXOL 350 MG/ML SOLN of contrast.  FINDINGS: Coronary calcium score: The patient's coronary artery calcium score is 105, which places the patient in the 66th percentile.  Coronary arteries: Normal coronary origins.  Right dominance.  Right Coronary Artery: Normal caliber vessel, gives rise to PDA. Focal mixed calcified and noncalcified plaque in proximal RCA with 1-24% stenosis.  Left Main Coronary Artery: Normal caliber vessel. No significant plaque or stenosis.  Left Anterior Descending Coronary Artery: Normal caliber vessel. Diffuse mixed calcified and noncalcified plaque throughout proximal and mid vessel, with maximum 1-24% stenosis proximally and 25-49% stenosis  mid. Gives rise to one large diagonal branch. There is a focal area of noncalcified plaque in mid D1 with 50-69% stenosis.  Left Circumflex Artery: Normal caliber vessel. Focal area of predominantly noncalcified plaque in proximal vessel with 1-24% stenosis. Gives rise to three small OM branches.  Aorta: Normal size, 37 mm at the mid ascending aorta (level of the PA bifurcation) measured double oblique. No aortic atherosclerosis. No dissection seen in visualized portions of the aorta.  Aortic Valve: No calcifications. Trileaflet.  Other findings:  Normal pulmonary vein drainage into the left atrium.  Normal left atrial appendage. Appendage does not completely opacify, likely mixing artifact, though thrombus cannot be excluded.  Normal size of the pulmonary artery.  Normal appearance of the pericardium.  IMPRESSION: 1. CAD with moderate stenosis, CADRADS = 3. CT FFR will be performed and reported separately. Mid D1 with 50-69% stenosis.  2. Coronary calcium score of 105. This was 66th percentile for age-, sex-, and race- matched controls.  3. Total plaque volume 264 mm3 which is 39th percentile for age- and sex- matched controls (calcified plaque 28 mm3; noncalcified plaque 236 mm3). Total plaque volume is severe.  4. Normal coronary origin with right dominance.  5. Left atrial appendage does not completely opacify, likely mixing artifact, though thrombus cannot be excluded. If there is clinical concern/risk (such as atrial fibrillation) could consider TEE or gated CT with delayed contrast imaging.  INTERPRETATION:  CAD-RADS 3: Moderate stenosis (50-69%). Consider symptom-guided anti-ischemic pharmacotherapy as well as  risk factor modification per guideline directed care. Additional analysis with CT FFR will be submitted.  Electronically Signed: By: Sheryle Donning M.D. On: 09/05/2023 12:01      ______________________________________________________________________________________________       Current Reported Medications:.    No outpatient medications have been marked as taking for the 09/25/23 encounter (Appointment) with [facility] D, NP.    Physical Exam:    VS:  There were no vitals taken for this visit.   Wt Readings from Last 3 Encounters:  08/23/23 211 lb (95.7 kg)  03/28/23 220 lb (99.8 kg)    GEN: Well nourished, well developed in no acute distress NECK: No JVD; No carotid bruits CARDIAC: ***RRR, no murmurs, rubs, gallops RESPIRATORY:  Clear to auscultation without rales, wheezing or rhonchi  ABDOMEN: Soft, non-tender, non-distended EXTREMITIES:  No edema; No acute deformity     Asessement and Plan:.     ***     Disposition: F/u with ***  Signed, Jaspreet Hollings D Tilia Faso, NP

## 2023-09-25 ENCOUNTER — Ambulatory Visit: Admitting: Cardiology

## 2023-09-25 DIAGNOSIS — G4733 Obstructive sleep apnea (adult) (pediatric): Secondary | ICD-10-CM

## 2023-09-25 DIAGNOSIS — I10 Essential (primary) hypertension: Secondary | ICD-10-CM

## 2023-09-25 DIAGNOSIS — E785 Hyperlipidemia, unspecified: Secondary | ICD-10-CM

## 2023-09-25 DIAGNOSIS — I2510 Atherosclerotic heart disease of native coronary artery without angina pectoris: Secondary | ICD-10-CM

## 2023-10-08 NOTE — Progress Notes (Signed)
 Cardiology Office Note    Date:  10/17/2023  ID:  JOHNAVON TARDO, DOB 02-27-1961, MRN [id number] PCP:  Jimmy Moulding, MD  Cardiologist:  Luana Rumple, MD  Electrophysiologist:  None   Chief Complaint: Follow up for CAD and HTN   History of Present Illness: .    ILIAS WINSOR is a 63 y.o. male with visit-pertinent history of WPW syndrome, hypertension, OSA on CPAP, ADHD, Mnire's disease, prediabetes/hyperlipidemia, false positive stress test leading to a negative cardiac catheterization in 2014 [facility], minor 15 to 20% plaquing in the mid LAD, 20 to 25% proximal RCA).  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  Ablation was previously unsuccessful although it appears that he eventually "outgrew it".  [name] previously used CPAP for sleep apnea, stopped use in 2020, he has been referred to Dr. Micael Adas.  Patient establish care with Dr. Alvis Ba on 08/23/2023.  Patient had noted that his blood pressure is previously easily controlled but had recently become higher and he became symptomatic with this.  In July 2024 his blood pressure was 130/79 at a visit with his PCP and he had problems with headache, dizziness and vision impairment.  Patient at office visit also noted he at 1 point had some pain radiating down his left arm.  Patient reported his blood pressure was as high as 159 over 115 mmHg at home.  At office visit patient's blood pressure on arrival was 120/100, on recheck was 125 over 98 mmHg.  Patient denied any symptoms with this.  It was recommended that patient undergo coronary CT angiography.   Coronary CTA on 08/31/2023 indicated coronary artery disease with moderate stenosis, mid D1 with 50 to 69% stenosis, FFR did not indicate significant stenosis.  Coronary calcium  score of 105, this was 66 percentile for age, race and sex matched control.  Total plaque volume was 264 mm which was 39th percentile for age and sex matched control,  the PPV was noted to severe.  It was recommended that patient start on rosuvastatin  20 mg daily.  Today he presents for follow up. He reports that he has had improvement in overall feeling since starting blood pressure medications, he denies any headache or vision impairments.  He reports that he has not had any dizziness up until the last 2 weeks, he notes that his blood pressure has been soft at home with his blood pressure at night averaging 100/75.  Patient reports dizziness and lightheadedness with position changes, denies any presyncope or syncope.  He denies any chest pain, shortness of breath, lower extremity edema, orthopnea or PND.  Patient remains active, working regularly and frequently having to travel for work.  ROS: .   Today he denies chest pain, shortness of breath, lower extremity edema, fatigue, palpitations, melena, hematuria, hemoptysis, diaphoresis, weakness, presyncope, syncope, orthopnea, and PND.  All other systems are reviewed and otherwise negative. Studies Reviewed: Aaron Aas    EKG:  EKG is ordered today, personally reviewed, demonstrating  EKG Interpretation Date/Time:  Tuesday Oct 17 2023 08:46:21 EDT Ventricular Rate:  105 PR Interval:  134 QRS Duration:  84 QT Interval:  358 QTC Calculation: 473 R Axis:   36  Text Interpretation: Sinus tachycardia When compared with ECG of 23-Aug-2023 10:13, Minimal criteria for Inferior infarct are no longer Present Confirmed by Lars Jeziorski [id number] on 10/17/2023 8:53:12 AM   CV Studies: Cardiac studies reviewed are outlined and summarized above. Otherwise please see EMR for full report.  Cardiac Studies & Procedures   ______________________________________________________________________________________________          CT SCANS  CT CORONARY MORPH W/CTA COR W/SCORE 09/04/2023  Addendum 09/15/2023 11:39 PM ADDENDUM REPORT: 09/15/2023 23:37  EXAM: OVER-READ INTERPRETATION  CT CHEST  The following report is an over-read  performed by radiologist Dr. Nicolette Barrio [facility] on 09/15/2023. This over-read does not include interpretation of cardiac or coronary anatomy or pathology. The coronary CTA interpretation by the cardiologist is attached.  COMPARISON:  None.  FINDINGS: There is atelectasis in the bilateral lower lobes. Visualized lower mediastinum and upper abdomen are within normal limits. No significant osseous abnormality.  IMPRESSION: 1. No significant extracardiac findings.   Electronically Signed By: Tyron Gallon M.D. On: 09/15/2023 23:37  Narrative HISTORY: Chest pain, nonspecific chest pain  EXAM: Cardiac/Coronary CT  TECHNIQUE: The patient was scanned on a [employer].  PROTOCOL: A 120 kV prospective scan was triggered in the descending thoracic aorta at 111 HU's. Axial non-contrast 3 mm slices were carried out through the heart. The data set was analyzed on a dedicated work station and scored using the Agatston method. Gantry rotation speed was 250 msecs and collimation was 0.6 mm. Heart rate was optimized medically and sl NTG was given. The 3D data set was reconstructed in 5% intervals of the 35-75 % of the R-R cycle. Systolic and diastolic phases were analyzed on a dedicated work station using MPR, MIP and VRT modes. The patient received 95mL OMNIPAQUE  IOHEXOL  350 MG/ML SOLN of contrast.  FINDINGS: Coronary calcium  score: The patient's coronary artery calcium  score is 105, which places the patient in the 66th percentile.  Coronary arteries: Normal coronary origins.  Right dominance.  Right Coronary Artery: Normal caliber vessel, gives rise to PDA. Focal mixed calcified and noncalcified plaque in proximal RCA with 1-24% stenosis.  Left Main Coronary Artery: Normal caliber vessel. No significant plaque or stenosis.  Left Anterior Descending Coronary Artery: Normal caliber vessel. Diffuse mixed calcified and noncalcified plaque throughout  proximal and mid vessel, with maximum 1-24% stenosis proximally and 25-49% stenosis mid. Gives rise to one large diagonal branch. There is a focal area of noncalcified plaque in mid D1 with 50-69% stenosis.  Left Circumflex Artery: Normal caliber vessel. Focal area of predominantly noncalcified plaque in proximal vessel with 1-24% stenosis. Gives rise to three small OM branches.  Aorta: Normal size, 37 mm at the mid ascending aorta (level of the PA bifurcation) measured double oblique. No aortic atherosclerosis. No dissection seen in visualized portions of the aorta.  Aortic Valve: No calcifications. Trileaflet.  Other findings:  Normal pulmonary vein drainage into the left atrium.  Normal left atrial appendage. Appendage does not completely opacify, likely mixing artifact, though thrombus cannot be excluded.  Normal size of the pulmonary artery.  Normal appearance of the pericardium.  IMPRESSION: 1. CAD with moderate stenosis, CADRADS = 3. CT FFR will be performed and reported separately. Mid D1 with 50-69% stenosis.  2. Coronary calcium  score of 105. This was 66th percentile for age-, sex-, and race- matched controls.  3. Total plaque volume 264 mm3 which is 39th percentile for age- and sex- matched controls (calcified plaque 28 mm3; noncalcified plaque 236 mm3). Total plaque volume is severe.  4. Normal coronary origin with right dominance.  5. Left atrial appendage does not completely opacify, likely mixing artifact, though thrombus cannot be excluded. If there is clinical concern/risk (such as atrial fibrillation) could consider TEE or gated CT with delayed contrast  imaging.  INTERPRETATION:  CAD-RADS 3: Moderate stenosis (50-69%). Consider symptom-guided anti-ischemic pharmacotherapy as well as risk factor modification per guideline directed care. Additional analysis with CT FFR will be submitted.  Electronically Signed: By: Sheryle Donning M.D. On:  09/05/2023 12:01     ______________________________________________________________________________________________       Current Reported Medications:.    Current Meds  Medication Sig   amphetamine-dextroamphetamine (ADDERALL XR) 20 MG 24 hr capsule Take 20 mg by mouth every morning.   aspirin EC 81 MG tablet Take 1 tablet (81 mg total) by mouth daily. Swallow whole.   atorvastatin (LIPITOR) 20 MG tablet Take 1 tablet (20 mg total) by mouth daily.   azelastine (ASTELIN) 0.1 % nasal spray Place into the nose as needed.   fluticasone (FLONASE) 50 MCG/ACT nasal spray Place 2 sprays into both nostrils daily.   ipratropium (ATROVENT) 0.03 % nasal spray Place 2 sprays into both nostrils as needed for rhinitis.   losartan  (COZAAR ) 25 MG tablet Take 1 tablet (25 mg total) by mouth daily.   meclizine (ANTIVERT) 25 MG tablet Take by mouth as needed.   meloxicam (MOBIC) 15 MG tablet Take 15 mg by mouth daily.   triamterene -hydrochlorothiazide (MAXZIDE-25) 37.5-25 MG tablet Take 1 tablet by mouth daily.   [DISCONTINUED] losartan  (COZAAR ) 100 MG tablet Take 0.5 tablets (50 mg total) by mouth daily.   [DISCONTINUED] rosuvastatin  (CRESTOR ) 20 MG tablet Take 1 tablet (20 mg total) by mouth daily.   [DISCONTINUED] tadalafil (CIALIS) 20 MG tablet Take 20 mg by mouth daily as needed.   Physical Exam:    VS:  BP 110/80   Pulse (!) 105   Ht 5\' 10"  (1.778 m)   Wt 213 lb (96.6 kg)   SpO2 93%   BMI 30.56 kg/m    Wt Readings from Last 3 Encounters:  10/17/23 213 lb (96.6 kg)  08/23/23 211 lb (95.7 kg)  03/28/23 220 lb (99.8 kg)   GEN: Well nourished, well developed in no acute distress NECK: No JVD; No carotid bruits CARDIAC: RRR, no murmurs, rubs, gallops RESPIRATORY:  Clear to auscultation without rales, wheezing or rhonchi  ABDOMEN: Soft, non-tender, non-distended EXTREMITIES:  No edema; No acute deformity     Asessement and Plan:.    CAD: Coronary CTA on 08/31/2023 indicated coronary  artery disease with moderate stenosis, mid D1 with 50 to 69% stenosis, FFR did not indicate significant stenosis.  Coronary calcium  score of 105, this was 66 percentile for age, race and sex matched control.  Total plaque volume was 264 mm which was 39th percentile for age and sex matched control, the PPV was noted to be severe.  It was recommended that patient start on rosuvastatin  20 mg daily. Today patient reports that he is overall doing well.  He denies any chest pain, shortness of breath. Patient noted to have elevated heart rate today, EKG indicates sinus tachycardia at 105 bpm, patient denies palpitations. Discussed starting beta blocker, patient deferred at this time, by end of visit heart rate improved to 88 bpm, he will monitor at home.  He notes that he was unable to tolerate rosuvastatin  20 mg daily due to increased back pain, is agreeable to trialing atorvastatin as noted below. Heart healthy diet and regular cardiovascular exercise encouraged.  Reviewed ED precautions.  Start aspirin 81 mg daily, continue losartan , triamterene  hydrochlorothiazide. Check BMET.   HTN: Blood pressure today 110/80. Patient reports readings at night of 100/70 with dizziness and lightheadedness in recent weeks. At last  office visit patient was started on losartan  50 mg daily, given soft blood pressures with symptoms will reduce losartan  to 25 mg daily.  Encouraged patient to monitor his blood pressure and heart rate at home and notify the office if consistently elevated above 130/80.  Check BMET.  If worsening renal function consider renal artery duplex.  Continue triamterene  hydrochlorothiazide.  HLP: Patient started on rosuvastatin  20 mg daily following results of coronary CTA.  Patient notes he was unable to tolerate given increased back pain and muscle aches, discontinued.  Is agreeable to trialing atorvastatin, patient to have recheck of fasting lipid profile in 2 months.   OSA: Patient with history of OSA.  Has  been referred to Dr. Micael Adas and to undergo split-night sleep study.    Disposition: F/u with Gabbie Marzo, NP in 4 weeks.   Signed, Andric Kerce D Rosali Augello, NP

## 2023-10-16 ENCOUNTER — Encounter (HOSPITAL_BASED_OUTPATIENT_CLINIC_OR_DEPARTMENT_OTHER): Payer: Self-pay

## 2023-10-17 ENCOUNTER — Ambulatory Visit: Attending: Cardiology | Admitting: Cardiology

## 2023-10-17 ENCOUNTER — Encounter: Payer: Self-pay | Admitting: Cardiology

## 2023-10-17 VITALS — BP 110/80 | HR 105 | Ht 70.0 in | Wt 213.0 lb

## 2023-10-17 DIAGNOSIS — E785 Hyperlipidemia, unspecified: Secondary | ICD-10-CM | POA: Diagnosis not present

## 2023-10-17 DIAGNOSIS — I10 Essential (primary) hypertension: Secondary | ICD-10-CM

## 2023-10-17 DIAGNOSIS — I2510 Atherosclerotic heart disease of native coronary artery without angina pectoris: Secondary | ICD-10-CM

## 2023-10-17 DIAGNOSIS — G4733 Obstructive sleep apnea (adult) (pediatric): Secondary | ICD-10-CM

## 2023-10-17 LAB — BASIC METABOLIC PANEL WITH GFR
BUN/Creatinine Ratio: 25 — ABNORMAL HIGH (ref 10–24)
BUN: 21 mg/dL (ref 8–27)
CO2: 21 mmol/L (ref 20–29)
Calcium: 9.7 mg/dL (ref 8.6–10.2)
Chloride: 99 mmol/L (ref 96–106)
Creatinine, Ser: 0.83 mg/dL (ref 0.76–1.27)
Glucose: 128 mg/dL — ABNORMAL HIGH (ref 70–99)
Potassium: 4.5 mmol/L (ref 3.5–5.2)
Sodium: 136 mmol/L (ref 134–144)
eGFR: 100 mL/min/{1.73_m2} (ref >59)

## 2023-10-17 MED ORDER — LOSARTAN POTASSIUM 25 MG PO TABS: 25.0000 mg | ORAL_TABLET | Freq: Every day | ORAL | 3 refills | Status: DC

## 2023-10-17 MED ORDER — ASPIRIN 81 MG PO TBEC
81.0000 mg | DELAYED_RELEASE_TABLET | Freq: Every day | ORAL | Status: AC
Start: 2023-10-17 — End: ?

## 2023-10-17 MED ORDER — ATORVASTATIN CALCIUM 20 MG PO TABS
20.0000 mg | ORAL_TABLET | Freq: Every day | ORAL | 3 refills | Status: DC
Start: 2023-10-17 — End: 2023-12-27

## 2023-10-17 NOTE — Patient Instructions (Addendum)
 Medication Instructions:  Stop metoprolol  Stop Rosuvastatin  Start Aspirin 81 mg once a day Start Atorvastatin 20 mg once a day Decrease losartan  to 25 mg once a day    *If you need a refill on your cardiac medications before your next appointment, please call your pharmacy*  Lab Work: Today we are going to draw a bmet If you have labs (blood work) drawn today and your tests are completely normal, you will receive your results only by: MyChart Message (if you have MyChart) OR A paper copy in the mail If you have any lab test that is abnormal or we need to change your treatment, we will call you to review the results.  Testing/Procedures: No testing  Follow-Up: At [facility], you and your health needs are our priority.  As part of our continuing mission to provide you with exceptional heart care, our providers are all part of one team.  This team includes your primary Cardiologist (physician) and Advanced Practice Providers or APPs (Physician Assistants and Nurse Practitioners) who all work together to provide you with the care you need, when you need it.  Your next appointment:   4-6 week(s)  Provider:   Katlyn West, NP   We recommend signing up for the patient portal called "MyChart".  Sign up information is provided on this After Visit Summary.  MyChart is used to connect with patients for Virtual Visits (Telemedicine).  Patients are able to view lab/test results, encounter notes, upcoming appointments, etc.  Non-urgent messages can be sent to your provider as well.   To learn more about what you can do with MyChart, go to [email or web address].   Other Instructions: If you are unable to tolerate the Atorvastatin reach out to the office to let us  know Please take your blood pressure daily for 2 weeks and send in a MyChart message. Please include heart rates. (One message at the end of the 2 weeks).   HOW TO TAKE YOUR BLOOD PRESSURE: Rest 5 minutes before taking your  blood pressure. Don't smoke or drink caffeinated beverages for at least 30 minutes before. Take your blood pressure before (not after) you eat. Sit comfortably with your back supported and both feet on the floor (don't cross your legs). Elevate your arm to heart level on a table or a desk. Use the proper sized cuff. It should fit smoothly and snugly around your bare upper arm. There should be enough room to slip a fingertip under the cuff. The bottom edge of the cuff should be 1 inch above the crease of the elbow. Ideally, take 3 measurements at one sitting and record the average.

## 2023-10-19 ENCOUNTER — Telehealth: Payer: Self-pay

## 2023-10-19 NOTE — Telephone Encounter (Signed)
 Called patient advised of below they verbalized understanding no questions

## 2023-10-19 NOTE — Telephone Encounter (Signed)
-----   Message from Katlyn D West sent at 10/18/2023  1:10 PM EDT ----- Please let Vincent Arias know that his kidney function is normal and his electrolytes are normal. Good results! Continue medications as discussed and follow up as planned.

## 2023-11-16 NOTE — Progress Notes (Signed)
 Cardiology Office Note    Date:  11/18/2023  ID:  Vincent Arias, DOB 1961/02/23, MRN [id number] PCP:  Jimmy Moulding, MD  Cardiologist:  Luana Rumple, MD  Electrophysiologist:  None   Chief Complaint: Follow up for CAD and HTN   History of Present Illness: .    Vincent Arias is a 63 y.o. male with visit-pertinent history of WPW syndrome, hypertension, OSA on CPAP, ADHD, Mnire's disease, prediabetes/hyperlipidemia, false positive stress test leading to a negative cardiac catheterization in 2014 [facility], minor 15 to 20% plaquing in the mid LAD, 20 to 25% proximal RCA).  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  Ablation was previously unsuccessful although it appears that he eventually "outgrew it".  [name] previously used CPAP for sleep apnea, stopped use in 2020, he has been referred to Dr. Micael Adas.  Patient establish care with Dr. Alvis Ba on 08/23/2023.  Patient had noted that his blood pressure is previously easily controlled but had recently become higher and he became symptomatic with this.  In July 2024 his blood pressure was 130/79 at a visit with his PCP and he had problems with headache, dizziness and vision impairment.  Patient at office visit also noted he at 1 point had some pain radiating down his left arm.  Patient reported his blood pressure was as high as 159 over 115 mmHg at home.  At office visit patient's blood pressure on arrival was 120/100, on recheck was 125 over 98 mmHg.  Patient denied any symptoms with this.  It was recommended that patient undergo coronary CT angiography.   Coronary CTA on 08/31/2023 indicated coronary artery disease with moderate stenosis, mid D1 with 50 to 69% stenosis, FFR did not indicate significant stenosis.  Coronary calcium  score of 105, this was 66 percentile for age, race and sex matched control.  Total plaque volume was 264 mm which was 39th percentile for age and sex matched control,  the PPV was noted to severe.  It was recommended that patient start on rosuvastatin  20 mg daily.  Patient was last seen in clinic on 10/17/2023.  Patient reported that he had improvement in overall feeling since doing on blood pressure medication.  He did report that his blood pressure have been soft at night averaging 100/75.  He did report dizziness and lightheadedness with position changes.  Denies any presyncope or syncope.  Patient's losartan  was reduced to 25 mg daily.  Patient was unable to tolerate rosuvastatin , was agreeable to trialing atorvastatin .  Today he presents for follow-up.  He reports that he is doing well overall.  He denies any chest pain, shortness of breath, lower extremity edema, orthopnea or PND.  Patient reports that he is tolerating lower dose losartan  very well, denies any significant dizziness or lightheadedness.  He reports that he is also tolerating atorvastatin  well, reports that his back and muscle pains have significantly improved, notes some hand pain which he relates to RA.  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  ROS: .   Today he denies chest pain, shortness of breath, lower extremity edema, fatigue, palpitations, melena, hematuria, hemoptysis, diaphoresis, weakness, presyncope, syncope, orthopnea, and PND.  All other systems are reviewed and otherwise negative. Studies Reviewed: Aaron Aas   EKG:  EKG is not ordered today.   CV Studies: Cardiac studies reviewed are outlined and summarized above. Otherwise please see  EMR for full report. Cardiac Studies & Procedures   ______________________________________________________________________________________________          CT SCANS  CT CORONARY MORPH W/CTA COR W/SCORE 09/04/2023  Addendum 09/15/2023 11:39 PM ADDENDUM REPORT: 09/15/2023 23:37  EXAM: OVER-READ INTERPRETATION  CT CHEST  The following report is an over-read performed by  radiologist Dr. Nicolette Barrio [facility] on 09/15/2023. This over-read does not include interpretation of cardiac or coronary anatomy or pathology. The coronary CTA interpretation by the cardiologist is attached.  COMPARISON:  None.  FINDINGS: There is atelectasis in the bilateral lower lobes. Visualized lower mediastinum and upper abdomen are within normal limits. No significant osseous abnormality.  IMPRESSION: 1. No significant extracardiac findings.   Electronically Signed By: Tyron Gallon M.D. On: 09/15/2023 23:37  Narrative HISTORY: Chest pain, nonspecific chest pain  EXAM: Cardiac/Coronary CT  TECHNIQUE: The patient was scanned on a [employer].  PROTOCOL: A 120 kV prospective scan was triggered in the descending thoracic aorta at 111 HU's. Axial non-contrast 3 mm slices were carried out through the heart. The data set was analyzed on a dedicated work station and scored using the Agatston method. Gantry rotation speed was 250 msecs and collimation was 0.6 mm. Heart rate was optimized medically and sl NTG was given. The 3D data set was reconstructed in 5% intervals of the 35-75 % of the R-R cycle. Systolic and diastolic phases were analyzed on a dedicated work station using MPR, MIP and VRT modes. The patient received 95mL OMNIPAQUE  IOHEXOL  350 MG/ML SOLN of contrast.  FINDINGS: Coronary calcium  score: The patient's coronary artery calcium  score is 105, which places the patient in the 66th percentile.  Coronary arteries: Normal coronary origins.  Right dominance.  Right Coronary Artery: Normal caliber vessel, gives rise to PDA. Focal mixed calcified and noncalcified plaque in proximal RCA with 1-24% stenosis.  Left Main Coronary Artery: Normal caliber vessel. No significant plaque or stenosis.  Left Anterior Descending Coronary Artery: Normal caliber vessel. Diffuse mixed calcified and noncalcified plaque throughout proximal and  mid vessel, with maximum 1-24% stenosis proximally and 25-49% stenosis mid. Gives rise to one large diagonal branch. There is a focal area of noncalcified plaque in mid D1 with 50-69% stenosis.  Left Circumflex Artery: Normal caliber vessel. Focal area of predominantly noncalcified plaque in proximal vessel with 1-24% stenosis. Gives rise to three small OM branches.  Aorta: Normal size, 37 mm at the mid ascending aorta (level of the PA bifurcation) measured double oblique. No aortic atherosclerosis. No dissection seen in visualized portions of the aorta.  Aortic Valve: No calcifications. Trileaflet.  Other findings:  Normal pulmonary vein drainage into the left atrium.  Normal left atrial appendage. Appendage does not completely opacify, likely mixing artifact, though thrombus cannot be excluded.  Normal size of the pulmonary artery.  Normal appearance of the pericardium.  IMPRESSION: 1. CAD with moderate stenosis, CADRADS = 3. CT FFR will be performed and reported separately. Mid D1 with 50-69% stenosis.  2. Coronary calcium  score of 105. This was 66th percentile for age-, sex-, and race- matched controls.  3. Total plaque volume 264 mm3 which is 39th percentile for age- and sex- matched controls (calcified plaque 28 mm3; noncalcified plaque 236 mm3). Total plaque volume is severe.  4. Normal coronary origin with right dominance.  5. Left atrial appendage does not completely opacify, likely mixing artifact, though thrombus cannot be excluded. If there is clinical concern/risk (such as atrial fibrillation) could consider TEE or gated  CT with delayed contrast imaging.  INTERPRETATION:  CAD-RADS 3: Moderate stenosis (50-69%). Consider symptom-guided anti-ischemic pharmacotherapy as well as risk factor modification per guideline directed care. Additional analysis with CT FFR will be submitted.  Electronically Signed: By: Sheryle Donning M.D. On: 09/05/2023  12:01     ______________________________________________________________________________________________       Current Reported Medications:.    Current Meds  Medication Sig   amphetamine-dextroamphetamine (ADDERALL XR) 20 MG 24 hr capsule Take 20 mg by mouth every morning.   aspirin  EC 81 MG tablet Take 1 tablet (81 mg total) by mouth daily. Swallow whole.   atorvastatin  (LIPITOR) 20 MG tablet Take 1 tablet (20 mg total) by mouth daily.   azelastine (ASTELIN) 0.1 % nasal spray Place into the nose as needed.   fluticasone (FLONASE) 50 MCG/ACT nasal spray Place 2 sprays into both nostrils daily.   ipratropium (ATROVENT) 0.03 % nasal spray Place 2 sprays into both nostrils as needed for rhinitis.   losartan  (COZAAR ) 25 MG tablet Take 1 tablet (25 mg total) by mouth daily.   meclizine (ANTIVERT) 25 MG tablet Take by mouth as needed.   meloxicam (MOBIC) 15 MG tablet Take 15 mg by mouth daily.   triamterene -hydrochlorothiazide (MAXZIDE-25) 37.5-25 MG tablet Take 1 tablet by mouth daily.    Physical Exam:    VS:  BP 112/68   Pulse 92   Ht 5\' 10"  (1.778 m)   Wt 212 lb 9.6 oz (96.4 kg)   SpO2 94%   BMI 30.50 kg/m    Wt Readings from Last 3 Encounters:  11/17/23 212 lb 9.6 oz (96.4 kg)  10/17/23 213 lb (96.6 kg)  08/23/23 211 lb (95.7 kg)    GEN: Well nourished, well developed in no acute distress NECK: No JVD; No carotid bruits CARDIAC: RRR, no murmurs, rubs, gallops RESPIRATORY:  Clear to auscultation without rales, wheezing or rhonchi  ABDOMEN: Soft, non-tender, non-distended EXTREMITIES:  No edema; No acute deformity     Asessement and Plan:.    CAD: Coronary CTA on 08/31/2023 indicated coronary artery disease with moderate stenosis, mid D1 with 50 to 69% stenosis, FFR did not indicate significant stenosis.  Coronary calcium  score of 105, this was 66 percentile for age, race and sex matched control.  Total plaque volume was 264 mm which was 39th percentile for age and sex  matched control, the TPV was noted to be severe.  Stable with no anginal symptoms. No indication for ischemic evaluation.  Heart healthy diet and regular cardiovascular exercise encouraged.  Continue aspirin  81 mg daily, atorvastatin  20 mg daily, losartan  25 mg daily and triamterene -hydrochlorothiazide 37.5-25mg  daily.   HTN: Blood pressure today 112/68. Patient reports that his dizziness and low blood pressures have significantly improved, he is tolerating Losartan  25 mg daily well. Encouraged patient to continue monitoring his blood pressures at home and notify the office if consistently elevated above 130/80. Continue current antihypertensive regimen.   HLP: Patient started on rosuvastatin  20 mg daily following results of coronary CTA.  Patient notes he was unable to tolerate given increased back pain and muscle aches, discontinued.  Patient was started on atorvastatin . Patient reports that he is tolerating atorvastatin  well. Check fasting lipid profile and LFTs in one month.   OSA: Patient with history of OSA, reports he has not had titration of his CPAP in many years, he has been referred to Dr. Anner Kill and is pending split night sleep study.    Disposition: F/u with Dr. Alvis Ba in 01/2024  as planned.   Signed, Mackensi Mahadeo D Lashala Laser, NP

## 2023-11-17 ENCOUNTER — Ambulatory Visit: Attending: Cardiology | Admitting: Cardiology

## 2023-11-17 ENCOUNTER — Encounter: Payer: Self-pay | Admitting: Cardiology

## 2023-11-17 VITALS — BP 112/68 | HR 92 | Ht 70.0 in | Wt 212.6 lb

## 2023-11-17 DIAGNOSIS — I2510 Atherosclerotic heart disease of native coronary artery without angina pectoris: Secondary | ICD-10-CM | POA: Diagnosis not present

## 2023-11-17 DIAGNOSIS — E785 Hyperlipidemia, unspecified: Secondary | ICD-10-CM

## 2023-11-17 DIAGNOSIS — I10 Essential (primary) hypertension: Secondary | ICD-10-CM

## 2023-11-17 DIAGNOSIS — G4733 Obstructive sleep apnea (adult) (pediatric): Secondary | ICD-10-CM | POA: Diagnosis not present

## 2023-11-17 NOTE — Patient Instructions (Signed)
 Medication Instructions:  No changes *If you need a refill on your cardiac medications before your next appointment, please call your pharmacy*  Lab Work: In 1 month we are going to need a fasting lipid panel and Lft's If you have labs (blood work) drawn today and your tests are completely normal, you will receive your results only by: MyChart Message (if you have MyChart) OR A paper copy in the mail If you have any lab test that is abnormal or we need to change your treatment, we will call you to review the results.  Testing/Procedures: No testing  Follow-Up: At [facility], you and your health needs are our priority.  As part of our continuing mission to provide you with exceptional heart care, our providers are all part of one team.  This team includes your primary Cardiologist (physician) and Advanced Practice Providers or APPs (Physician Assistants and Nurse Practitioners) who all work together to provide you with the care you need, when you need it.  Your next appointment:   Keep upcoming appointment   Provider:   Luana Rumple, MD    We recommend signing up for the patient portal called "MyChart".  Sign up information is provided on this After Visit Summary.  MyChart is used to connect with patients for Virtual Visits (Telemedicine).  Patients are able to view lab/test results, encounter notes, upcoming appointments, etc.  Non-urgent messages can be sent to your provider as well.   To learn more about what you can do with MyChart, go to [email or web address].

## 2023-11-18 ENCOUNTER — Encounter: Payer: Self-pay | Admitting: Cardiology

## 2023-12-01 MED ORDER — TRIAMTERENE-HCTZ 37.5-25 MG PO TABS: 1.0000 | ORAL_TABLET | Freq: Every day | ORAL | 3 refills | Status: AC

## 2023-12-01 NOTE — Telephone Encounter (Signed)
Refilled per patient request. 

## 2023-12-19 ENCOUNTER — Telehealth: Payer: Self-pay

## 2023-12-19 DIAGNOSIS — I2510 Atherosclerotic heart disease of native coronary artery without angina pectoris: Secondary | ICD-10-CM

## 2023-12-19 DIAGNOSIS — I10 Essential (primary) hypertension: Secondary | ICD-10-CM

## 2023-12-19 DIAGNOSIS — G4733 Obstructive sleep apnea (adult) (pediatric): Secondary | ICD-10-CM

## 2023-12-19 NOTE — Telephone Encounter (Signed)
**Note De-Identified Nupur Hohman Obfuscation** I did a Split Night Sleep Study PA through the [employer] provider portal and received the following message: This request does not meet review criteria for an In-Lab Sleep Study (PSG) based on the answers in the Contraindications for Home Sleep Testing category(ies).  Further clinical review is required You have the following options: Switch to Devices using Peripheral Arterial Tone (PAT) (Itamar-HST). Switch to HST - Type III  Forwarding this message to Dr Francyne for his recommendation.

## 2023-12-20 NOTE — Telephone Encounter (Signed)
 Please order HST type 3

## 2023-12-20 NOTE — Telephone Encounter (Addendum)
**Note De-Identified Damen Windsor Obfuscation** Per the [employer] provider portal this Home Sleep Study Type 3 has been approved as follows: Order ID: [id number] Completed Approval Valid Through: 12/20/2023 - 12/28/2024  I called the pt to make him aware that his insurance [employer] denied coverage of his Split Night Sleep Study so I s/w Dr Francyne who changed the order to a Home Sleep Study Type 3 and that [employer] did approve this study.  He needs to be advised that the sleep lab will be contacting him to schedule a time for him to come to the lab to get a Home Sleep Type 3 Device with instructions.

## 2023-12-22 NOTE — Telephone Encounter (Addendum)
**Note De-Identified Vincent Arias Obfuscation** The pt is aware that [employer] denied coverage of his Split Night Sleep Study so I s/w Dr Francyne who changed the order to a Home Sleep Study Type 3 and that [employer] did approve this study.   He is aware that someone from the sleep lab will be contacting him to schedule his Home Sleep Study Type 3.  I have transferred the order to the sleep lab.

## 2023-12-23 LAB — HEPATIC FUNCTION PANEL
ALT: 40 IU/L (ref 0–44)
AST: 29 IU/L (ref 0–40)
Albumin: 4.5 g/dL (ref 3.9–4.9)
Alkaline Phosphatase: 85 IU/L (ref 44–121)
Bilirubin Total: 0.5 mg/dL (ref 0.0–1.2)
Bilirubin, Direct: 0.24 mg/dL (ref 0.00–0.40)
Total Protein: 7.2 g/dL (ref 6.0–8.5)

## 2023-12-23 LAB — LIPID PANEL
Chol/HDL Ratio: 3.6 ratio (ref 0.0–5.0)
Cholesterol, Total: 144 mg/dL (ref 100–199)
HDL: 40 mg/dL (ref >39)
LDL Chol Calc (NIH): 77 mg/dL (ref 0–99)
Triglycerides: 157 mg/dL — ABNORMAL HIGH (ref 0–149)
VLDL Cholesterol Cal: 27 mg/dL (ref 5–40)

## 2023-12-25 ENCOUNTER — Ambulatory Visit: Payer: Self-pay | Admitting: Cardiology

## 2023-12-25 DIAGNOSIS — E785 Hyperlipidemia, unspecified: Secondary | ICD-10-CM

## 2023-12-27 MED ORDER — ATORVASTATIN CALCIUM 40 MG PO TABS: 40.0000 mg | ORAL_TABLET | Freq: Every day | ORAL | 3 refills | Status: AC

## 2023-12-27 NOTE — Telephone Encounter (Signed)
-----   Message from Katlyn D West sent at 12/25/2023  7:06 AM EDT ----- Please let Vincent Arias know that his liver function is normal. His triglycerides are slightly high and his LDL or bad cholesterol remains above goal, his LDL is currently 77, goal is at least below 70.  Recommend increasing his atorvastatin  to 40 mg daily with repeat fasting lipid profile and LFTs in 2 months.  ----- Message ----- From: Interface, [facility] Lab Results In Sent: 12/23/2023   5:37 AM EDT To: Katlyn D West, NP

## 2023-12-27 NOTE — Telephone Encounter (Signed)
 Called patient advised of below they verbalized understanding Ordered labs and sent medication to pharmacy

## 2024-01-22 ENCOUNTER — Ambulatory Visit (HOSPITAL_BASED_OUTPATIENT_CLINIC_OR_DEPARTMENT_OTHER): Attending: Cardiovascular Disease | Admitting: Cardiology

## 2024-01-22 DIAGNOSIS — G4733 Obstructive sleep apnea (adult) (pediatric): Secondary | ICD-10-CM | POA: Diagnosis present

## 2024-01-22 DIAGNOSIS — I10 Essential (primary) hypertension: Secondary | ICD-10-CM | POA: Insufficient documentation

## 2024-01-22 DIAGNOSIS — I2510 Atherosclerotic heart disease of native coronary artery without angina pectoris: Secondary | ICD-10-CM | POA: Insufficient documentation

## 2024-01-24 NOTE — Procedures (Signed)
 [facility] [address] [place], [place] [address] Tel: [phone]   Fax: [phone]  Home Sleep Test Interpretation  Patient Name: Vincent Arias, Vincent Arias Date: 01/22/2024  Date of Birth: [birth year] Study Type: HST  Age: 63 year MRN #: [id number]  Sex: Male Interpreting Physician: SHLOMO WILBERT LIVERS  Height: 5' 10 Referring Physician: MIHAI CROITORU [id number])  Weight: 212.0 lbs Recording Tech: Will Poet RRT RPSGT RST  BMI: 30.7 Scoring Tech: Will Poet RRT RPSGT RST  ESS: 9 Neck Size: 16.5  %%star  % Indications for Polysomnography The patient is a 63 year old Male who is 5' 10 and weighs 212.0 lbs. His BMI equals 30.7.  A home sleep apnea test was performed to evaluate for Obstructive Sleep Apnea.  Medication  No Data.   Polysomnogram Data A home sleep test recorded the standard physiologic parameters including EKG, nasal and oral airflow.  Respiratory parameters of chest and abdominal movements were recorded with Respiratory Inductance Plethysmography belts.  Oxygen saturation was recorded by pulse oximetry.   Study Architecture The total recording time of the polysomnogram was 374.0 minutes. The total monitoring time was 374.5 minutes.  Time spent in Supine position was 30.5 minutes.   Respiratory Events The study revealed a presence of 17 obstructive, 5 centrals, and 0 mixed apneas resulting in an Apnea index of 3.5 events per hour.  There were 159 hypopneas (>=3% desaturation and/or arousal) resulting in an Apnea\Hypopnea Index (AHI >=3% desaturation and/or arousal) of 29.0 events per hour.  There were 113 hypopneas (>=4% desaturation) resulting in an Apnea\Hypopnea Index (AHI >=4% desaturation) of 21.6 events per hour.  There were 0 Respiratory Effort Related Arousals resulting in a RERA index of 0 events per hour. The Respiratory Disturbance Index is 29.0 events per hour.  The snore index was 0 events per hour.  Mean  oxygen saturation was 95.4%.  The lowest oxygen saturation during monitoring time was 86.0%.  Time spent <=88% oxygen saturation was 0.8 minutes (0.2%).  Cardiac Summary The average pulse rate was 78.6 bpm.  The minimum pulse rate was 66.0 bpm while the maximum pulse rate was 110.0 bpm.    Diagnosis:  Moderate Obstructive Sleep Apnea  Recommendations: 1.  Clinical correlation of these findings is necessary.  The decision to treat obstructive sleep apnea (OSA) is usually based on the presence of apnea symptoms or the presence of associated medical conditions such as Hypertension, Congestive Heart Failure, Atrial Fibrillation or Obesity.  The most common symptoms of OSA are snoring, gasping for breath while sleeping, daytime sleepiness and fatigue.   2.  Initiating apnea therapy is recommended given the presence of symptoms and/or associated conditions. Recommend proceeding with one of the following:     a.  Auto-CPAP therapy with a pressure range of 5-20cm H2O.     b.  An oral appliance (OA) that can be obtained from certain dentists with expertise in sleep medicine.  These are primarily of use in non-obese patients with mild and moderate disease.     c.  An ENT consultation which may be useful to look for specific causes of obstruction and possible treatment options.     d.  If patient is intolerant to PAP therapy, consider referral to ENT for evaluation for hypoglossal nerve stimulator.   3.  Close follow-up is necessary to ensure success with CPAP or oral appliance therapy for maximum benefit.  4.  A follow-up oximetry study on CPAP is recommended to assess the adequacy  of therapy and determine the need for supplemental oxygen or the potential need for Bi-level therapy.  An arterial blood gas to determine the adequacy of baseline ventilation and oxygenation should also be considered.  5.  Healthy sleep recommendations include:  adequate nightly sleep (normal 7-9 hrs/night), avoidance of  caffeine after noon and alcohol near bedtime, and maintaining a sleep environment that is cool, dark and quiet.  6.  Weight loss for overweight patients is recommended.  Even modest amounts of weight loss can significantly improve the severity of sleep apnea.  7.  Snoring recommendations include:  weight loss where appropriate, side sleeping, and avoidance of alcohol before bed.  8.  Operation of motor vehicle should be avoided when sleepy.    This study was personally reviewed and electronically signed by: Wilbert Bihari, MD Accredited Board Certified in Sleep Medicine Date/Time: 01/24/2024 12:45PM

## 2024-02-02 ENCOUNTER — Encounter: Payer: Self-pay | Admitting: Cardiovascular Disease

## 2024-02-02 ENCOUNTER — Ambulatory Visit: Attending: Cardiovascular Disease | Admitting: Cardiovascular Disease

## 2024-02-02 VITALS — BP 92/64 | HR 98 | Ht 70.0 in | Wt 212.4 lb

## 2024-02-02 DIAGNOSIS — I10 Essential (primary) hypertension: Secondary | ICD-10-CM

## 2024-02-02 DIAGNOSIS — G4733 Obstructive sleep apnea (adult) (pediatric): Secondary | ICD-10-CM

## 2024-02-02 DIAGNOSIS — I2510 Atherosclerotic heart disease of native coronary artery without angina pectoris: Secondary | ICD-10-CM

## 2024-02-02 DIAGNOSIS — E785 Hyperlipidemia, unspecified: Secondary | ICD-10-CM

## 2024-02-02 DIAGNOSIS — J3489 Other specified disorders of nose and nasal sinuses: Secondary | ICD-10-CM

## 2024-02-02 NOTE — Progress Notes (Signed)
 Patient Cardiology Office Note:    Date:  02/02/2024   ID:  HUIE GHUMAN, DOB 1960/08/23, MRN [id number]  PCP:  Lenon Layman ORN, MD    [facility] Providers Cardiologist:  Jerel Balding, MD     Referring MD: Lenon Layman ORN, MD   Chief Complaint  Patient presents with   Hypertension        Sleep Apnea    History of Present Illness:    Vincent Arias is a 63 y.o. male with a hx of WPW syndrome, hypertension, OSA on CPAP, ADHD, Mnire's disease, prediabetes/hyperlipidemia, false positive stress test leading to a negative catheterization [facility] 2014, minor 15-20% plaquing in the mid LAD; 20-25% proximal RCA).    He is generally doing quite well, except for complaints of persistent fatigue.  His blood pressure has been very well-controlled and his lipid he is taking losartan  25 mg daily as well as triamterene -hydrochlorothiazide 37.5-25 mg daily.  He has not had syncope but he has had some episodes of weakness and orthostatic dizziness.  His problems with Mnire's syndrome are not currently troublesome.  His sleep study confirmed the presence of moderate obstructive sleep apnea and he is waiting to receive information regarding CPAP equipment.  He did use CPAP years past, but became intolerant of it when he had COVID in 2020 and has been off treatment for several years.  He has not had chest pain or shortness of breath at rest with activity.  He denies lower extremity edema, claudication, orthopnea, PND.  Coronary CT angiogram performed September 15, 2023 showed diffuse coronary plaque with the most severe lesion being a 50-69% stenosis in the first diagonal artery, without any large vessel or severe obstructive lesions.  FFR was normal in all territories.  The coronary calcium  score was 105 (66%ile).  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  Ablation was unsuccessful, but it appears that he outgrew it.  Has been many  years since he had a bout of SVT. He has been on Adderall XR for ADHD for a long time.  His most recent metabolic profile from 12/22/2023 shows several borderline values with a cholesterol 144, HDL of 40, LDL 77 (markedly improved from 125 a year ago), triglycerides 157.  His most recent hemoglobin A1c was 5.9%.  He has normal renal function.  Creatinine 0.83 and potassium 4.5 on 10/17/2023.  Past Medical History:  Diagnosis Date   Meniere disease of left ear 2023   had surgery to repair    Past Surgical History:  Procedure Laterality Date   ANTERIOR CRUCIATE LIGAMENT REPAIR     APPENDECTOMY     CARPAL TUNNEL RELEASE     LEFT HEART CATH      Current Medications: Current Meds  Medication Sig   amphetamine-dextroamphetamine (ADDERALL XR) 20 MG 24 hr capsule Take 20 mg by mouth every morning.   aspirin  EC 81 MG tablet Take 1 tablet (81 mg total) by mouth daily. Swallow whole.   atorvastatin  (LIPITOR) 40 MG tablet Take 1 tablet (40 mg total) by mouth daily.   fluticasone (FLONASE) 50 MCG/ACT nasal spray Place 2 sprays into both nostrils daily.   gabapentin (NEURONTIN) 100 MG capsule Take 100 mg by mouth 2 (two) times daily.   meloxicam (MOBIC) 15 MG tablet Take 15 mg by mouth daily.   triamterene -hydrochlorothiazide (MAXZIDE-25) 37.5-25 MG tablet Take 1 tablet by mouth daily.   [DISCONTINUED] losartan  (COZAAR ) 25 MG tablet Take 1  tablet (25 mg total) by mouth daily.     Allergies:   Patient has no known allergies.   Family History: The patient's family history is not on file.  ROS:   Please see the history of present illness.     All other systems reviewed and are negative.  EKGs/Labs/Other Studies Reviewed:    The following studies were reviewed today:       EKG Interpretation Date/Time:    Ventricular Rate:    PR Interval:    QRS Duration:    QT Interval:    QTC Calculation:   R Axis:      Text Interpretation:           Recent Labs: 08/23/2023: Hemoglobin  17.3; Platelets 309 10/17/2023: BUN 21; Creatinine, Ser 0.83; Potassium 4.5; Sodium 136 12/22/2023: ALT 40  Recent Lipid Panel    Component Value Date/Time   CHOL 144 12/22/2023 0858   TRIG 157 (H) 12/22/2023 0858   HDL 40 12/22/2023 0858   CHOLHDL 3.6 12/22/2023 0858   LDLCALC 77 12/22/2023 0858   December 19, 2022 hemoglobin A1c 5.9%, potassium 4.5, creatinine 0.8, normal liver function tests cholesterol 201, triglycerides 161, HDL 44, calculated LDL 125  10/17/2023 Creatinine 0.83, potassium 4.5   12/22/2023 cholesterol 144, HDL 40, LDL 77, triglycerides 157  Risk Assessment/Calculations:                   Physical Exam:    VS:  BP 92/64 (BP Location: Left Arm, Patient Position: Sitting, Cuff Size: Large)   Pulse 98   Ht 5' 10 (1.778 m)   Wt 212 lb 6.4 oz (96.3 kg)   SpO2 96%   BMI 30.48 kg/m     Wt Readings from Last 3 Encounters:  02/02/24 212 lb 6.4 oz (96.3 kg)  01/22/24 212 lb (96.2 kg)  11/17/23 212 lb 9.6 oz (96.4 kg)     General: Alert, oriented x3, no distress, borderline obese Head: no evidence of trauma, PERRL, EOMI, no exophtalmos or lid lag, no myxedema, no xanthelasma; normal ears, nose and oropharynx Neck: normal jugular venous pulsations and no hepatojugular reflux; brisk carotid pulses without delay and no carotid bruits Chest: clear to auscultation, no signs of consolidation by percussion or palpation, normal fremitus, symmetrical and full respiratory excursions Cardiovascular: normal position and quality of the apical impulse, regular rhythm, normal first and second heart sounds, no murmurs, rubs or gallops Abdomen: no tenderness or distention, no masses by palpation, no abnormal pulsatility or arterial bruits, normal bowel sounds, no hepatosplenomegaly Extremities: no clubbing, cyanosis or edema; 2+ radial, ulnar and brachial pulses bilaterally; 2+ right femoral, posterior tibial and dorsalis pedis pulses; 2+ left femoral, posterior tibial and  dorsalis pedis pulses; no subclavian or femoral bruits Neurological: grossly nonfocal Psych: Normal mood and affect   ASSESSMENT:    1. Coronary artery disease involving native coronary artery of native heart without angina pectoris   2. Essential hypertension   3. Dyslipidemia (high LDL; low HDL)   4. OSA (obstructive sleep apnea)   5. Nasal obstruction     PLAN:    In order of problems listed above:  CAD: Moderate nonobstructive disease, elevated coronary calcium  score, but currently asymptomatic and without any serious lesions.  The focus is on risk factors.  Ideally would want his LDL cholesterol 70 or less. HTN: His blood pressure is now actually low and he has some symptoms of hypotension.  Will stop the losartan , which no longer appears  necessary.  I wonder if it just took a long time for the diuretic to kick in. HLP: Most of his lipid parameters are borderline with HDL 40, LDL 77 and triglycerides 157.  I would not make any changes to his medicines today, but encourage weight loss, increase physical activity, a diet low in sweets and starches with high glycemic index. OSA: He has not been compliant with CPAP in the last 5 years and we are waiting follow-up on his abnormal sleep study, with assignment of a DME provider and follow-up in our sleep clinic. Nasal obstruction: Often has a hard time breathing since one side of his nose seems to be always stopped up.  He thinks he may have had some injuries to his nose when riding motor bikes as a kid[place]  He would like a referral to an ENT specialist.  Will refer to Dr. Carlie, since he may eventually want to also discuss an inspire device instead of CPAP.     Medication Adjustments/Labs and Tests Ordered: Current medicines are reviewed at length with the patient today.  Concerns regarding medicines are outlined above.  Orders Placed This Encounter  Procedures   Ambulatory referral to ENT   No orders of the  defined types were placed in this encounter.   Patient Instructions  Medication Instructions:  Stop Losartan  *If you need a refill on your cardiac medications before your next appointment, please call your pharmacy*  Lab Work: None ordered If you have labs (blood work) drawn today and your tests are completely normal, you will receive your results only by: MyChart Message (if you have MyChart) OR A paper copy in the mail If you have any lab test that is abnormal or we need to change your treatment, we will call you to review the results.  Testing/Procedures: None ordered  Follow-Up: At [facility], you and your health needs are our priority.  As part of our continuing mission to provide you with exceptional heart care, our providers are all part of one team.  This team includes your primary Cardiologist (physician) and Advanced Practice Providers or APPs (Physician Assistants and Nurse Practitioners) who all work together to provide you with the care you need, when you need it.  Your next appointment:   1 year(s)  Provider:   Jerel Balding, MD    We recommend signing up for the patient portal called MyChart.  Sign up information is provided on this After Visit Summary.  MyChart is used to connect with patients for Virtual Visits (Telemedicine).  Patients are able to view lab/test results, encounter notes, upcoming appointments, etc.  Non-urgent messages can be sent to your provider as well.   To learn more about what you can do with MyChart, go to [email or web address].          Signed, Jerel Balding, MD  02/02/2024 2:26 PM    [facility]

## 2024-02-02 NOTE — Patient Instructions (Signed)
 Medication Instructions:  Stop Losartan  *If you need a refill on your cardiac medications before your next appointment, please call your pharmacy*  Lab Work: None ordered If you have labs (blood work) drawn today and your tests are completely normal, you will receive your results only by: MyChart Message (if you have MyChart) OR A paper copy in the mail If you have any lab test that is abnormal or we need to change your treatment, we will call you to review the results.  Testing/Procedures: None ordered  Follow-Up: At [facility], you and your health needs are our priority.  As part of our continuing mission to provide you with exceptional heart care, our providers are all part of one team.  This team includes your primary Cardiologist (physician) and Advanced Practice Providers or APPs (Physician Assistants and Nurse Practitioners) who all work together to provide you with the care you need, when you need it.  Your next appointment:   1 year(s)  Provider:   Jerel Balding, MD    We recommend signing up for the patient portal called MyChart.  Sign up information is provided on this After Visit Summary.  MyChart is used to connect with patients for Virtual Visits (Telemedicine).  Patients are able to view lab/test results, encounter notes, upcoming appointments, etc.  Non-urgent messages can be sent to your provider as well.   To learn more about what you can do with MyChart, go to [email or web address].

## 2024-02-05 ENCOUNTER — Telehealth: Payer: Self-pay | Admitting: *Deleted

## 2024-02-05 DIAGNOSIS — I10 Essential (primary) hypertension: Secondary | ICD-10-CM

## 2024-02-05 DIAGNOSIS — I2510 Atherosclerotic heart disease of native coronary artery without angina pectoris: Secondary | ICD-10-CM

## 2024-02-05 DIAGNOSIS — G4733 Obstructive sleep apnea (adult) (pediatric): Secondary | ICD-10-CM

## 2024-02-05 NOTE — Telephone Encounter (Signed)
-----   Message from Wilbert Bihari sent at 01/24/2024 12:47 PM EDT ----- Please let patient know that they have sleep apnea.  Recommend therapeutic CPAP titration for treatment of patient's sleep disordered breathing.

## 2024-02-05 NOTE — Telephone Encounter (Signed)
 The patient has been notified of the result and verbalized understanding.  All questions (if any) were answered. Joshua Dalton Seip, CMA 02/05/2024 3:07 PM    Patient agrees to cpap titration

## 2024-02-06 ENCOUNTER — Telehealth: Payer: Self-pay

## 2024-02-06 DIAGNOSIS — I2510 Atherosclerotic heart disease of native coronary artery without angina pectoris: Secondary | ICD-10-CM

## 2024-02-06 DIAGNOSIS — I10 Essential (primary) hypertension: Secondary | ICD-10-CM

## 2024-02-06 DIAGNOSIS — G4733 Obstructive sleep apnea (adult) (pediatric): Secondary | ICD-10-CM

## 2024-02-06 NOTE — Telephone Encounter (Signed)
**Note De-Identified Murle Otting Obfuscation** I started a CPAP Titration through the [employer] Provider Portal. Received a message that the pt does not qualify for a in-lab CPAP Titration but that they will cover a APAP. See below for approval for APAP.  Forwarding this message to Dr Shlomo for her recommendation.

## 2024-02-09 NOTE — Telephone Encounter (Signed)
 Per Dr Shlomo, order a ResMed auto CPAP from 4 to 15 cm H2O    Upon patient request DME selection is [facility] Patient understands he will be contacted by [facility] to set up his cpap. Patient understands to call if [facility] does not contact him with new setup in a timely manner. Patient understands they will be called once confirmation has been received from [facility] that they have received their new machine to schedule 10 week follow up appointment.   [facility] notified of new cpap order  Please add to airview Patient was grateful for the call and thanked me.

## 2024-05-11 ENCOUNTER — Ambulatory Visit
Admission: RE | Admit: 2024-05-11 | Discharge: 2024-05-11 | Disposition: A | Discharge status: Home / Self Care | Payer: Self-pay | Attending: Nurse Practitioner | Admitting: Nurse Practitioner

## 2024-05-11 ENCOUNTER — Other Ambulatory Visit: Payer: Self-pay

## 2024-05-11 VITALS — BP 132/89 | HR 95 | Temp 98.4°F | Resp 18

## 2024-05-11 DIAGNOSIS — R300 Dysuria: Secondary | ICD-10-CM | POA: Insufficient documentation

## 2024-05-11 DIAGNOSIS — N410 Acute prostatitis: Secondary | ICD-10-CM | POA: Insufficient documentation

## 2024-05-11 LAB — POCT URINE DIPSTICK
Bilirubin, UA: NEGATIVE
Glucose, UA: NEGATIVE mg/dL
Ketones, POC UA: NEGATIVE mg/dL
Nitrite, UA: POSITIVE — AB
POC PROTEIN,UA: 30 — AB
Spec Grav, UA: 1.015 (ref 1.010–1.025)
Urobilinogen, UA: 1 U/dL
pH, UA: 7 (ref 5.0–8.0)

## 2024-05-11 MED ORDER — PHENAZOPYRIDINE HCL 200 MG PO TABS: 200.0000 mg | ORAL_TABLET | ORAL | 0 refills | Status: AC

## 2024-05-11 MED ORDER — CIPROFLOXACIN HCL 500 MG PO TABS: 500.0000 mg | ORAL_TABLET | Freq: Two times a day (BID) | ORAL | 0 refills | Status: AC

## 2024-05-11 MED ORDER — TAMSULOSIN HCL 0.4 MG PO CAPS: 0.4000 mg | ORAL_CAPSULE | Freq: Every day | ORAL | 0 refills | Status: AC

## 2024-05-11 NOTE — Discharge Instructions (Addendum)
 You were diagnosed with acute prostatitis, which is an infection of the prostate that can cause painful urination, frequent or urgent urination, weak urine stream, back pain, body aches, chills, and fever. Take ciprofloxacin exactly as prescribed twice daily for the full 14 days, even if you start to feel better before finishing the medication. You were also prescribed tamsulosin (Flomax) once daily for 10 days to help relax the prostate and improve urine flow, and Pyridium to help relieve burning with urination; Pyridium may turn your urine orange, which is normal. Drink plenty of fluids to help flush your urinary system and avoid caffeine and alcohol, which can worsen bladder irritation. Warm baths or heating pads to the lower back or pelvic area may help relieve discomfort, and Tylenol can be used for additional pain or fever relief if needed.  Follow up with your primary care provider or a urologist after completing treatment or sooner if symptoms do not start to improve within a few days. Go to the emergency department immediately if you develop high fever, chills or shaking, inability to urinate, worsening or severe back or abdominal pain, vomiting that prevents taking medications, weakness or confusion, or any signs of serious infection.

## 2024-05-11 NOTE — ED Provider Notes (Signed)
 Vincent Arias    CSN: [id number] Arrival date & time: 05/11/24  1234      History   Chief Complaint Chief Complaint  Patient presents with   Urinary Frequency    Entered by patient    HPI Vincent Arias is a 63 y.o. male.   Discussed the use of AI scribe software for clinical note transcription with the patient, who gave verbal consent to proceed.   The patient presents with a four-day history of urinary symptoms including dysuria, increased urinary frequency and urgency, decreased urine output with weak urinary stream, and intermittent dribbling. He also reports associated left-sided back pain, body aches, chills, sweats, fatigue, and subjective fever with a maximum temperature of 100F. He denies hematuria, abdominal pain, nausea, or vomiting.  The patient denies any known history of prostate disease and states that his primary care provider checks prostate labs annually, all of which have been normal. He reports a remote history of using Flomax approximately 25 years ago for presumed prostatitis, which he states significantly improved his symptoms at that time.  The following sections of the patient's history were reviewed and updated as appropriate: allergies, current medications, past family history, past medical history, past social history, past surgical history, and problem list.       Past Medical History:  Diagnosis Date   Meniere disease of left ear 2023   had surgery to repair    Patient Active Problem List   Diagnosis Date Noted   Acute URI 03/28/2023    Past Surgical History:  Procedure Laterality Date   ANTERIOR CRUCIATE LIGAMENT REPAIR     APPENDECTOMY     CARPAL TUNNEL RELEASE     LEFT HEART CATH         Home Medications    Prior to Admission medications   Medication Sig Start Date End Date Taking? Authorizing Provider  acetaminophen (TYLENOL) 500 MG tablet Take 1,000 mg by mouth every 6 (six) hours as needed for mild pain  (pain score 1-3) or moderate pain (pain score 4-6).   Yes [provider]  azelastine (ASTELIN) 0.1 % nasal spray Place 1 spray into both nostrils 2 (two) times daily. Use in each nostril as directed   Yes [provider]  ciprofloxacin (CIPRO) 500 MG tablet Take 1 tablet (500 mg total) by mouth 2 (two) times daily for 14 days. 05/11/24 05/25/24 Yes Erin Obando, FNP  meclizine (ANTIVERT) 25 MG tablet Take 25 mg by mouth 3 (three) times daily as needed for dizziness.   Yes [provider]  Multiple Vitamin (MULTIVITAMIN WITH MINERALS) TABS tablet Take 1 tablet by mouth daily.   Yes [provider]  phenazopyridine (PYRIDIUM) 200 MG tablet Take 1 tablet (200 mg total) by mouth 3 (three) times daily at 8am, 3pm and bedtime for 2 days. 05/11/24 05/13/24 Yes Iola Lukes, FNP  tamsulosin (FLOMAX) 0.4 MG CAPS capsule Take 1 capsule (0.4 mg total) by mouth daily. 05/11/24  Yes Iola Lukes, FNP  amphetamine-dextroamphetamine (ADDERALL XR) 20 MG 24 hr capsule Take 20 mg by mouth every morning.    [provider]  aspirin  EC 81 MG tablet Take 1 tablet (81 mg total) by mouth daily. Swallow whole. 10/17/23   West, Katlyn D, NP  atorvastatin  (LIPITOR) 40 MG tablet Take 1 tablet (40 mg total) by mouth daily. 12/27/23 03/26/24  West, Katlyn D, NP  fluticasone (FLONASE) 50 MCG/ACT nasal spray Place 2 sprays into both nostrils daily.    [provider]  gabapentin (NEURONTIN) 100 MG capsule Take 100 mg by mouth 2 (two) times daily. 01/11/24 01/17/25  [provider]  meloxicam (MOBIC) 15 MG tablet Take 15 mg by mouth daily. 12/29/18 06/25/24  [provider]  triamterene -hydrochlorothiazide (MAXZIDE-25) 37.5-25 MG tablet Take 1 tablet by mouth daily. 12/01/23   West, Katlyn D, NP    Family History History reviewed. No pertinent family history.  Social History Social History   Tobacco Use   Smoking status: Never   Smokeless tobacco:  Never  Vaping Use   Vaping status: Never Used  Substance Use Topics   Alcohol use: Not Currently   Drug use: Never     Allergies   Patient has no known allergies.   Review of Systems Review of Systems  Constitutional:  Positive for chills, diaphoresis, fatigue and fever (TMAX 100).  Genitourinary:  Positive for decreased urine volume (weak urine flow), dysuria, frequency and urgency. Negative for hematuria.  Musculoskeletal:  Positive for back pain (left sided) and myalgias.  All other systems reviewed and are negative.    Physical Exam Triage Vital Signs ED Triage Vitals  Encounter Vitals Group     BP 05/11/24 1301 132/89     Girls Systolic BP Percentile --      Girls Diastolic BP Percentile --      Boys Systolic BP Percentile --      Boys Diastolic BP Percentile --      Pulse Rate 05/11/24 1301 95     Resp 05/11/24 1301 18     Temp 05/11/24 1301 98.4 F (36.9 C)     Temp src --      SpO2 05/11/24 1301 96 %     Weight --      Height --      Head Circumference --      Peak Flow --      Pain Score 05/11/24 1307 6     Pain Loc --      Pain Education --      Exclude from Growth Chart --    No data found.  Updated Vital Signs BP 132/89   Pulse 95   Temp 98.4 F (36.9 C)   Resp 18   SpO2 96%   Visual Acuity Right Eye Distance:   Left Eye Distance:   Bilateral Distance:    Right Eye Near:   Left Eye Near:    Bilateral Near:     Physical Exam Vitals reviewed.  Constitutional:      General: He is awake. He is not in acute distress.    Appearance: Normal appearance. He is well-developed. He is not ill-appearing, toxic-appearing or diaphoretic.  HENT:     Head: Normocephalic.     Right Ear: Hearing normal.     Left Ear: Hearing normal.     Nose: Nose normal.     Mouth/Throat:     Mouth: Mucous membranes are moist.  Eyes:     General: Vision grossly intact.     Conjunctiva/sclera: Conjunctivae normal.  Cardiovascular:     Rate and Rhythm: Normal  rate and regular rhythm.     Heart sounds: Normal heart sounds.  Pulmonary:     Effort: Pulmonary effort is normal.     Breath sounds: Normal breath sounds and air entry.  Musculoskeletal:        General: Normal range of motion.     Cervical back: Normal range of motion and neck supple.  Skin:  General: Skin is warm and dry.  Neurological:     General: No focal deficit present.     Mental Status: He is alert and oriented to person, place, and time.  Psychiatric:        Speech: Speech normal.        Behavior: Behavior is cooperative.      UC Treatments / Results  Labs (all labs ordered are listed, but only abnormal results are displayed) Labs Reviewed  POCT URINE DIPSTICK - Abnormal; Notable for the following components:      Result Value   Clarity, UA cloudy (*)    Blood, UA moderate (*)    POC PROTEIN,UA =30 (*)    Nitrite, UA Positive (*)    Leukocytes, UA Small (1+) (*)    All other components within normal limits  URINE CULTURE    EKG   Radiology No results found.  Procedures Procedures (including critical care time)  Medications Ordered in UC Medications - No data to display  Initial Impression / Assessment and Plan / UC Course  I have reviewed the triage vital signs and the nursing notes.  Pertinent labs & imaging results that were available during my care of the patient were reviewed by me and considered in my medical decision making (see chart for details).     The patient presents with dysuria, urinary frequency and urgency, weak urinary stream with dribbling, decreased urine output, left-sided back pain, body aches, chills, sweats, fatigue, and subjective fever with a Tmax of 100F. Urinalysis demonstrates cloudy urine with moderate red blood cells, positive nitrites, and leukocytes, indicating urinary tract infection. The combination of lower urinary tract symptoms with systemic complaints and urinalysis findings is most consistent with acute  prostatitis. He denies abdominal pain, nausea, vomiting, or hematuria. Treatment was initiated with ciprofloxacin twice daily for 14 days for bacterial coverage, tamsulosin (Flomax) once daily for 10 days to improve urinary flow, and Pyridium for symptomatic relief of dysuria. A urine culture was sent to identify the causative organism and guide therapy if adjustments are needed. The patient was instructed to follow up with his primary care provider or urology for reassessment and to seek emergency care for worsening fever, inability to urinate, uncontrolled pain, vomiting, weakness, or any signs of systemic illness or sepsis.  Today's evaluation has revealed no signs of a dangerous process. Discussed diagnosis with patient and/or guardian. Patient and/or guardian aware of their diagnosis, possible red flag symptoms to watch out for and need for close follow up. Patient and/or guardian understands verbal and written discharge instructions. Patient and/or guardian comfortable with plan and disposition.  Patient and/or guardian has a clear mental status at this time, good insight into illness (after discussion and teaching) and has clear judgment to make decisions regarding their care  Documentation was completed with the aid of voice recognition software. Transcription may contain typographical errors.  Final Clinical Impressions(s) / UC Diagnoses   Final diagnoses:  Dysuria  Acute bacterial prostatitis     Discharge Instructions      You were diagnosed with acute prostatitis, which is an infection of the prostate that can cause painful urination, frequent or urgent urination, weak urine stream, back pain, body aches, chills, and fever. Take ciprofloxacin exactly as prescribed twice daily for the full 14 days, even if you start to feel better before finishing the medication. You were also prescribed tamsulosin (Flomax) once daily for 10 days to help relax the prostate and improve urine flow, and  Pyridium to help relieve burning with urination; Pyridium may turn your urine orange, which is normal. Drink plenty of fluids to help flush your urinary system and avoid caffeine and alcohol, which can worsen bladder irritation. Warm baths or heating pads to the lower back or pelvic area may help relieve discomfort, and Tylenol can be used for additional pain or fever relief if needed.  Follow up with your primary care provider or a urologist after completing treatment or sooner if symptoms do not start to improve within a few days. Go to the emergency department immediately if you develop high fever, chills or shaking, inability to urinate, worsening or severe back or abdominal pain, vomiting that prevents taking medications, weakness or confusion, or any signs of serious infection.     ED Prescriptions     Medication Sig Dispense Auth. Provider   tamsulosin (FLOMAX) 0.4 MG CAPS capsule Take 1 capsule (0.4 mg total) by mouth daily. 10 capsule Iola Lukes, FNP   ciprofloxacin (CIPRO) 500 MG tablet Take 1 tablet (500 mg total) by mouth 2 (two) times daily for 14 days. 28 tablet Iola Lukes, FNP   phenazopyridine (PYRIDIUM) 200 MG tablet Take 1 tablet (200 mg total) by mouth 3 (three) times daily at 8am, 3pm and bedtime for 2 days. 6 tablet Iola Lukes, FNP      PDMP not reviewed this encounter.   Iola Lukes, FNP 05/11/24 1400

## 2024-05-11 NOTE — ED Triage Notes (Signed)
 Pt being seen in UC for urinary frequency, burning with urination, and back pain. Pt denies otc medication.  Pt reports having fever and flu like s/s approx 4 days ago.

## 2024-05-13 ENCOUNTER — Ambulatory Visit (HOSPITAL_COMMUNITY): Payer: Self-pay

## 2024-05-13 LAB — URINE CULTURE: Culture: >=100000 — AB
